# Patient Record
Sex: Female | Born: 1965 | Race: White | Hispanic: No | Marital: Married | Smoking: Never smoker
Health system: Southern US, Community
[De-identification: ages and names within clinical notes are randomized; demographics above are authoritative.]

## PROBLEM LIST (undated history)

## (undated) DIAGNOSIS — D509 Iron deficiency anemia, unspecified: Principal | ICD-10-CM

## (undated) DIAGNOSIS — S52502A Unspecified fracture of the lower end of left radius, initial encounter for closed fracture: Secondary | ICD-10-CM

## (undated) DIAGNOSIS — M722 Plantar fascial fibromatosis: Secondary | ICD-10-CM

## (undated) DIAGNOSIS — G43909 Migraine, unspecified, not intractable, without status migrainosus: Secondary | ICD-10-CM

## (undated) DIAGNOSIS — K909 Intestinal malabsorption, unspecified: Secondary | ICD-10-CM

## (undated) DIAGNOSIS — Z9884 Bariatric surgery status: Secondary | ICD-10-CM

## (undated) DIAGNOSIS — J302 Other seasonal allergic rhinitis: Secondary | ICD-10-CM

## (undated) DIAGNOSIS — M6530 Trigger finger, unspecified finger: Secondary | ICD-10-CM

## (undated) HISTORY — DX: Plantar fascial fibromatosis: M72.2

## (undated) HISTORY — DX: Migraine, unspecified, not intractable, without status migrainosus: G43.909

## (undated) HISTORY — DX: Morbid (severe) obesity due to excess calories: E66.01

## (undated) HISTORY — DX: Iron deficiency anemia, unspecified: D50.9

## (undated) HISTORY — DX: Intestinal malabsorption, unspecified: K90.9

## (undated) HISTORY — DX: Bariatric surgery status: Z98.84

---

## 2006-06-17 ENCOUNTER — Other Ambulatory Visit: Admission: RE | Admit: 2006-06-17 | Discharge: 2006-06-17 | Payer: Self-pay | Admitting: Obstetrics & Gynecology

## 2006-09-08 HISTORY — PX: LATERAL COLLATERAL LIGAMENT REPAIR, ELBOW: SHX1956

## 2006-10-27 ENCOUNTER — Encounter: Admission: RE | Admit: 2006-10-27 | Discharge: 2006-11-23 | Payer: Self-pay | Admitting: Family Medicine

## 2007-08-03 ENCOUNTER — Ambulatory Visit (HOSPITAL_BASED_OUTPATIENT_CLINIC_OR_DEPARTMENT_OTHER): Admission: RE | Admit: 2007-08-03 | Discharge: 2007-08-03 | Payer: Self-pay | Admitting: Orthopaedic Surgery

## 2009-06-29 ENCOUNTER — Ambulatory Visit (HOSPITAL_COMMUNITY): Admission: RE | Admit: 2009-06-29 | Discharge: 2009-06-29 | Payer: Self-pay | Admitting: Family Medicine

## 2010-01-31 ENCOUNTER — Encounter: Admission: RE | Admit: 2010-01-31 | Discharge: 2010-01-31 | Payer: Self-pay | Admitting: Family Medicine

## 2010-07-05 ENCOUNTER — Ambulatory Visit (HOSPITAL_COMMUNITY): Admission: RE | Admit: 2010-07-05 | Discharge: 2010-07-05 | Payer: Self-pay | Admitting: Obstetrics & Gynecology

## 2011-01-21 NOTE — Op Note (Signed)
Vickie Velazquez, Vickie Velazquez             ACCOUNT NO.:  [id number]   MEDICAL RECORD NO.:  [id number]          PATIENT TYPE:  AMB   LOCATION:  DSC                          FACILITY:  [facility]   PHYSICIAN:  Lubertha Basque. Dalldorf, M.D.DATE OF BIRTH:  [birth year]   DATE OF PROCEDURE:  08/03/2007  DATE OF DISCHARGE:                               OPERATIVE REPORT   PREOPERATIVE DIAGNOSIS:  Right elbow lateral epicondylitis.   POSTOPERATIVE DIAGNOSIS:  Right elbow lateral epicondylitis.   PROCEDURE:  Right elbow lateral release.   ANESTHESIA:  General.   ATTENDING SURGEON:  Lubertha Basque. Jerl Santos, M.D.   ASSISTANT:  Lindwood Qua, P.A.   INDICATIONS FOR PROCEDURE:  The patient is a 45 year old woman with a  long history of right elbow pain.  We have followed her for years and  she has failed exercise programs and oral anti-inflammatories and  bracing and five or six injections.  She has pain which limits ability  to use her hand for meaningful activities including work.  She is  offered operative release.  Informed operative consent was obtained  after discussion of possible complications of reaction to anesthesia,  infection, neurovascular injury.  The patient also understood about the  three month recovery process required after this type of intervention.   SUMMARY OF FINDINGS AND PROCEDURE:  Under general anesthesia through  small lateral incision the extensor origin was visualized.  A  longitudinal split was performed.  We removed some devitalized tissue  from the origin of the extensors and then closed primarily.   DESCRIPTION OF PROCEDURE:  The patient was taken to the operating suite  where general anesthetic was applied without difficulty.  She was  positioned supine and prepped and draped in normal sterile fashion.  After administration of preop IV Kefzol, the right arm was elevated,  exsanguinated, and a tourniquet inflated about the upper arm.  A small  lateral incision was made with  dissection down to the lateral condyle.  A longitudinal split was made in the extensor origin.  Some devitalized  tissue was removed from both aspects of this split.  I used a rongeur to  freshen the underlying lateral epicondyle bone.  We then placed a single  stitch in this longitudinal split and this was Vicryl suture.  The  tourniquet was deflated.  A small amount bleeding was easily controlled  with Bovie cautery and pressure.  The wound was irrigated followed by  reapproximation of subcutaneous tissues with 2-0 undyed Vicryl and skin  with nylon.  Adaptic was applied followed by dry gauze and loose Ace  wrap.  Estimated blood loss and intraoperative fluids as well as  accurate tourniquet time can be obtained from anesthesia records.   DISPOSITION:  The patient was extubated in the operating room and taken  to recovery in stable addition.  She was to go home same-day follow up  in the office in less than a week.  I will contact her by phone tonight.      Lubertha Basque Jerl Santos, M.D.  Electronically Signed     PGD/MEDQ  D:  08/03/2007  T:  08/03/2007  Job:  [id number]

## 2011-06-12 ENCOUNTER — Other Ambulatory Visit: Payer: Self-pay | Admitting: Obstetrics & Gynecology

## 2011-06-12 DIAGNOSIS — Z1231 Encounter for screening mammogram for malignant neoplasm of breast: Secondary | ICD-10-CM

## 2011-06-17 LAB — POCT HEMOGLOBIN-HEMACUE
Hemoglobin: 14.8
Operator id: 116011

## 2011-07-11 ENCOUNTER — Ambulatory Visit (HOSPITAL_COMMUNITY)
Admission: RE | Admit: 2011-07-11 | Discharge: 2011-07-11 | Disposition: A | Discharge status: Home / Self Care | Payer: BC Managed Care – PPO | Source: Ambulatory Visit | Attending: Obstetrics & Gynecology | Admitting: Obstetrics & Gynecology

## 2011-07-11 DIAGNOSIS — Z1231 Encounter for screening mammogram for malignant neoplasm of breast: Secondary | ICD-10-CM | POA: Insufficient documentation

## 2011-10-03 ENCOUNTER — Other Ambulatory Visit: Payer: Self-pay | Admitting: Orthopedic Surgery

## 2011-10-10 ENCOUNTER — Encounter (HOSPITAL_BASED_OUTPATIENT_CLINIC_OR_DEPARTMENT_OTHER): Payer: Self-pay | Admitting: *Deleted

## 2011-10-10 NOTE — Progress Notes (Signed)
Snores-denies osa No labs needed

## 2011-10-15 NOTE — Progress Notes (Signed)
Reviewed chart with Dr. Sampson Goon due to BMI of 50.6.  OK for surgery per Dr. Sampson Goon.

## 2011-10-16 ENCOUNTER — Encounter (HOSPITAL_BASED_OUTPATIENT_CLINIC_OR_DEPARTMENT_OTHER)
Admission: RE | Disposition: A | Discharge status: Home / Self Care | Payer: Self-pay | Source: Ambulatory Visit | Attending: Orthopedic Surgery

## 2011-10-16 ENCOUNTER — Encounter (HOSPITAL_BASED_OUTPATIENT_CLINIC_OR_DEPARTMENT_OTHER): Payer: Self-pay | Admitting: Anesthesiology

## 2011-10-16 ENCOUNTER — Ambulatory Visit (HOSPITAL_BASED_OUTPATIENT_CLINIC_OR_DEPARTMENT_OTHER): Payer: BC Managed Care – PPO | Admitting: Anesthesiology

## 2011-10-16 ENCOUNTER — Ambulatory Visit (HOSPITAL_BASED_OUTPATIENT_CLINIC_OR_DEPARTMENT_OTHER)
Admission: RE | Admit: 2011-10-16 | Discharge: 2011-10-16 | Disposition: A | Discharge status: Home / Self Care | Payer: BC Managed Care – PPO | Source: Ambulatory Visit | Attending: Orthopedic Surgery | Admitting: Orthopedic Surgery

## 2011-10-16 ENCOUNTER — Encounter (HOSPITAL_BASED_OUTPATIENT_CLINIC_OR_DEPARTMENT_OTHER): Payer: Self-pay

## 2011-10-16 DIAGNOSIS — K219 Gastro-esophageal reflux disease without esophagitis: Secondary | ICD-10-CM | POA: Insufficient documentation

## 2011-10-16 DIAGNOSIS — M6530 Trigger finger, unspecified finger: Secondary | ICD-10-CM | POA: Insufficient documentation

## 2011-10-16 HISTORY — PX: TRIGGER FINGER RELEASE: SHX641

## 2011-10-16 HISTORY — DX: Other seasonal allergic rhinitis: J30.2

## 2011-10-16 SURGERY — RELEASE, A1 PULLEY, FOR TRIGGER FINGER
Anesthesia: Choice | Laterality: Right

## 2011-10-16 MED ORDER — BUPIVACAINE HCL (PF) 0.25 % IJ SOLN
INTRAMUSCULAR | Status: DC | PRN
  Administered 2011-10-16: 10 mL

## 2011-10-16 MED ORDER — LACTATED RINGERS IV SOLN
INTRAVENOUS | Status: DC
  Administered 2011-10-16: 08:00:00 via INTRAVENOUS

## 2011-10-16 MED ORDER — PROMETHAZINE HCL 25 MG/ML IJ SOLN: 6.2500 mg | INTRAMUSCULAR | Status: DC | PRN

## 2011-10-16 MED ORDER — FENTANYL CITRATE 0.05 MG/ML IJ SOLN
INTRAMUSCULAR | Status: DC | PRN
  Administered 2011-10-16: 100 ug via INTRAVENOUS

## 2011-10-16 MED ORDER — OXYCODONE-ACETAMINOPHEN 5-325 MG PO TABS: ORAL_TABLET | ORAL | Status: AC

## 2011-10-16 MED ORDER — CHLORHEXIDINE GLUCONATE 4 % EX LIQD: 60.0000 mL | Freq: Once | CUTANEOUS | Status: DC

## 2011-10-16 MED ORDER — PROPOFOL 10 MG/ML IV EMUL
INTRAVENOUS | Status: DC | PRN
  Administered 2011-10-16 (×2): 20 mg via INTRAVENOUS

## 2011-10-16 MED ORDER — CEFAZOLIN SODIUM 1-5 GM-% IV SOLN
INTRAVENOUS | Status: DC | PRN
  Administered 2011-10-16: 2 g via INTRAVENOUS

## 2011-10-16 MED ORDER — ONDANSETRON HCL 4 MG/2ML IJ SOLN
INTRAMUSCULAR | Status: DC | PRN
  Administered 2011-10-16: 4 mg via INTRAVENOUS

## 2011-10-16 MED ORDER — PROPOFOL 10 MG/ML IV EMUL
INTRAVENOUS | Status: DC | PRN
  Administered 2011-10-16: 75 ug/kg/min via INTRAVENOUS

## 2011-10-16 MED ORDER — MIDAZOLAM HCL 5 MG/5ML IJ SOLN
INTRAMUSCULAR | Status: DC | PRN
  Administered 2011-10-16: 2 mg via INTRAVENOUS

## 2011-10-16 MED ORDER — FENTANYL CITRATE 0.05 MG/ML IJ SOLN: 25.0000 ug | INTRAMUSCULAR | Status: DC | PRN

## 2011-10-16 SURGICAL SUPPLY — 33 items
BANDAGE COBAN STERILE 2 (GAUZE/BANDAGES/DRESSINGS) ×2 IMPLANT
BANDAGE CONFORM 2  STR LF (GAUZE/BANDAGES/DRESSINGS) ×2 IMPLANT
BLADE MINI RND TIP GREEN BEAV (BLADE) IMPLANT
BLADE SURG 15 STRL LF DISP TIS (BLADE) ×2 IMPLANT
BLADE SURG 15 STRL SS (BLADE) ×2
BNDG CMPR MD 5X2 ELC HKLP STRL (GAUZE/BANDAGES/DRESSINGS)
BNDG ELASTIC 2 VLCR STRL LF (GAUZE/BANDAGES/DRESSINGS) IMPLANT
BNDG ESMARK 4X9 LF (GAUZE/BANDAGES/DRESSINGS) ×2 IMPLANT
CHLORAPREP W/TINT 26ML (MISCELLANEOUS) ×2 IMPLANT
CLOTH BEACON ORANGE TIMEOUT ST (SAFETY) ×2 IMPLANT
CORDS BIPOLAR (ELECTRODE) ×2 IMPLANT
COVER MAYO STAND STRL (DRAPES) ×2 IMPLANT
COVER TABLE BACK 60X90 (DRAPES) ×2 IMPLANT
CUFF TOURNIQUET SINGLE 18IN (TOURNIQUET CUFF) ×2 IMPLANT
DRAPE EXTREMITY T 121X128X90 (DRAPE) ×2 IMPLANT
DRAPE SURG 17X23 STRL (DRAPES) ×2 IMPLANT
GAUZE XEROFORM 1X8 LF (GAUZE/BANDAGES/DRESSINGS) ×2 IMPLANT
GLOVE BIO SURGEON STRL SZ7.5 (GLOVE) ×2 IMPLANT
GOWN PREVENTION PLUS XLARGE (GOWN DISPOSABLE) ×2 IMPLANT
GOWN STRL REIN XL XLG (GOWN DISPOSABLE) ×2 IMPLANT
NEEDLE HYPO 25X1 1.5 SAFETY (NEEDLE) ×2 IMPLANT
NS IRRIG 1000ML POUR BTL (IV SOLUTION) ×2 IMPLANT
PACK BASIN DAY SURGERY FS (CUSTOM PROCEDURE TRAY) ×2 IMPLANT
PADDING CAST ABS 4INX4YD NS (CAST SUPPLIES) ×1
PADDING CAST ABS COTTON 4X4 ST (CAST SUPPLIES) ×1 IMPLANT
SPONGE GAUZE 4X4 12PLY (GAUZE/BANDAGES/DRESSINGS) ×2 IMPLANT
STOCKINETTE 4X48 STRL (DRAPES) ×2 IMPLANT
SUT ETHILON 4 0 PS 2 18 (SUTURE) ×2 IMPLANT
SYR BULB 3OZ (MISCELLANEOUS) ×2 IMPLANT
SYR CONTROL 10ML LL (SYRINGE) ×2 IMPLANT
TOWEL OR 17X24 6PK STRL BLUE (TOWEL DISPOSABLE) ×2 IMPLANT
UNDERPAD 30X30 INCONTINENT (UNDERPADS AND DIAPERS) ×2 IMPLANT
WATER STERILE IRR 1000ML POUR (IV SOLUTION) IMPLANT

## 2011-10-16 NOTE — Op Note (Signed)
Dictation [id number]

## 2011-10-16 NOTE — Transfer of Care (Signed)
Immediate Anesthesia Transfer of Care Note  Patient: Vickie Velazquez  Procedure(s) Performed:  RELEASE TRIGGER FINGER/A-1 PULLEY - right small finger  Patient Location: PACU  Anesthesia Type: MAC  Level of Consciousness: awake and alert   Airway & Oxygen Therapy: Patient Spontanous Breathing  Post-op Assessment: Report given to PACU RN and Post -op Vital signs reviewed and stable  Post vital signs: Reviewed and stable  Complications: No apparent anesthesia complications

## 2011-10-16 NOTE — Anesthesia Postprocedure Evaluation (Signed)
  Anesthesia Post-op Note  Patient: Vickie Velazquez  Procedure(s) Performed:  RELEASE TRIGGER FINGER/A-1 PULLEY - right small finger  Patient Location: PACU  Anesthesia Type: MAC  Level of Consciousness: awake, alert  and oriented  Airway and Oxygen Therapy: Patient Spontanous Breathing  Post-op Pain: none  Post-op Assessment: Post-op Vital signs reviewed, Patient's Cardiovascular Status Stable, Respiratory Function Stable, Patent Airway, No signs of Nausea or vomiting and Pain level controlled  Post-op Vital Signs: Reviewed and stable  Complications: No apparent anesthesia complications

## 2011-10-16 NOTE — Brief Op Note (Signed)
10/16/2011  9:38 AM  PATIENT:  Ranae Plumber  46 y.o. female  PRE-OPERATIVE DIAGNOSIS:  right small trigger finger  POST-OPERATIVE DIAGNOSIS:  right small trigger finger  PROCEDURE:  Procedure(s): RELEASE TRIGGER FINGER/A-1 PULLEY  SURGEON:  Surgeon(s): Tami Ribas, MD  PHYSICIAN ASSISTANT:   ASSISTANTS: none   ANESTHESIA:   local and MAC  EBL:  Total I/O In: 100 [I.V.:100] Out: -   BLOOD ADMINISTERED:none  DRAINS: none   LOCAL MEDICATIONS USED:  MARCAINE 10 CC  SPECIMEN:  No Specimen  DISPOSITION OF SPECIMEN:  N/A  COUNTS:  YES  TOURNIQUET:   Total Tourniquet Time Documented: Forearm (Right) - 19 minutes  DICTATION: .Other Dictation: Dictation Number [id number]  PLAN OF CARE: Discharge to home after PACU  PATIENT DISPOSITION:  PACU - hemodynamically stable.

## 2011-10-16 NOTE — H&P (Signed)
  Vickie Velazquez is an 46 y.o. female.   Chief Complaint: right trigger digit HPI: 46 yo rhd female with triggering of right small finger.  Injected x 2 with recurrence of triggering.  Would like release of A1 pulley.    Past Medical History  Diagnosis Date  . GERD (gastroesophageal reflux disease)   . Seasonal allergies     Past Surgical History  Procedure Date  . Lateral collateral ligament repair, elbow 2008    lateral release-rt elbow    Family History  Problem Relation Age of Onset  . Anesthesia problems Mother     hard to wake up   Social History:  reports that she has never smoked. She does not have any smokeless tobacco history on file. She reports that she drinks alcohol. She reports that she does not use illicit drugs.  Allergies:  Allergies  Allergen Reactions  . Sulfa Antibiotics Rash    Medications Prior to Admission  Medication Dose Route Frequency Provider Last Rate Last Dose  . chlorhexidine (HIBICLENS) 4 % liquid 4 application  60 mL Topical Once       . lactated ringers infusion   Intravenous Continuous E. Jairo Ben, MD 20 mL/hr at 10/16/11 0802     Medications Prior to Admission  Medication Sig Dispense Refill  . estradiol (ESTRACE) 0.5 MG tablet Take 0.5 mg by mouth daily.      . fluticasone (FLONASE) 50 MCG/ACT nasal spray Place 2 sprays into the nose daily.      . naproxen (NAPROSYN) 500 MG tablet Take 500 mg by mouth 2 (two) times daily with a meal.      . omeprazole (PRILOSEC) 20 MG capsule Take 20 mg by mouth daily.      . temazepam (RESTORIL) 15 MG capsule Take 15 mg by mouth at bedtime as needed.      . Vitamin D, Ergocalciferol, (DRISDOL) 50000 UNITS CAPS Take 50,000 Units by mouth.        No results found for this or any previous visit (from the past 48 hour(s)).  No results found.   A comprehensive review of systems was negative except for: Eyes: positive for contacts/glasses Hematologic/lymphatic: positive for easy  bruising Neurological: positive for headaches  Blood pressure 146/85, pulse 71, temperature 97.9 F (36.6 C), temperature source Oral, resp. rate 20, height 5\' 4"  (1.626 m), weight 133.811 kg (295 lb), SpO2 96.00%.  General appearance: alert, cooperative and appears stated age Head: Normocephalic, without obvious abnormality, atraumatic Neck: supple, symmetrical, trachea midline Resp: clear to auscultation bilaterally Cardio: regular rate and rhythm GI: soft, non-tender; bowel sounds normal; no masses,  no organomegaly Extremities: sensation and capillary refill intact all digits.  +epl/fpl/io.  tender and palpable flexor tendon nodule volar to right small finger mp joint. Pulses: 2+ and symmetric Skin: Skin color, texture, turgor normal. No rashes or lesions Neurologic: Grossly normal Incision/Wound: na  Assessment/Plan Right small finger trigger digit.  Discussed release of A1 pulley and risks, benefits, and alternatives of surgery.  Patient wishes to proceed.  Tyan Lasure R 10/16/2011, 8:53 AM

## 2011-10-16 NOTE — Anesthesia Preprocedure Evaluation (Signed)
Anesthesia Evaluation  Patient identified by MRN, date of birth, ID band Patient awake    Reviewed: Allergy & Precautions, H&P , NPO status , Patient's Chart, lab work & pertinent test results  History of Anesthesia Complications Negative for: history of anesthetic complications  Airway Mallampati: II TM Distance: >3 FB Neck ROM: Full    Dental No notable dental hx. (+) Teeth Intact and Dental Advisory Given   Pulmonary neg pulmonary ROS,  clear to auscultation  Pulmonary exam normal       Cardiovascular neg cardio ROS Regular Normal    Neuro/Psych Negative Neurological ROS  Negative Psych ROS   GI/Hepatic Neg liver ROS, GERD-  Medicated and Controlled,  Endo/Other  Morbid obesity  Renal/GU negative Renal ROS     Musculoskeletal   Abdominal (+) obese,   Peds  Hematology   Anesthesia Other Findings   Reproductive/Obstetrics                           Anesthesia Physical Anesthesia Plan  ASA: III  Anesthesia Plan: MAC   Post-op Pain Management:    Induction:   Airway Management Planned: Simple Face Mask  Additional Equipment:   Intra-op Plan:   Post-operative Plan:   Informed Consent: I have reviewed the patients History and Physical, chart, labs and discussed the procedure including the risks, benefits and alternatives for the proposed anesthesia with the patient or authorized representative who has indicated his/her understanding and acceptance.   Dental advisory given  Plan Discussed with: CRNA and Surgeon  Anesthesia Plan Comments: (Plan routine monitors, MAC)        Anesthesia Quick Evaluation

## 2011-10-17 NOTE — Op Note (Signed)
[name], [name] NO.:  [id number]  MEDICAL RECORD NO.:  [id number]  LOCATION:                                 FACILITY:  PHYSICIAN:  Betha Loa, MD        DATE OF BIRTH:  December 01, 1965  DATE OF PROCEDURE:  10/16/2011 DATE OF DISCHARGE:                              OPERATIVE REPORT   PREOPERATIVE DIAGNOSIS:  Right small finger trigger digit.  POSTOPERATIVE DIAGNOSIS:  Right small finger trigger digit.  PROCEDURE:  Right small finger A1 pulley release.  SURGEON:  Betha Loa, MD  ASSISTANT:  None.  ANESTHESIA:  General.  INTRAVENOUS FLUIDS:  Per anesthesia flow sheet.  ESTIMATED BLOOD LOSS:  Minimal.  COMPLICATIONS:  None.  SPECIMENS:  None.  TOURNIQUET TIME:  90 minutes.  DISPOSITION:  Stable to PACU.  INDICATIONS:  Vickie Velazquez is a 46 year old female who has had triggering of the right small finger for many months.  She has had injections x2.  These have helped, but the triggering always recurs. She wished to have the right small finger trigger digit release.  Risks, benefits, and alternatives of surgery were discussed including the risks of blood loss, infection, damage to nerves, vessels, tendons, ligaments, and bone, failure of surgery, need for additional surgery, complications with wound healing, continued pain, continued triggering.  She voiced understanding these risks and elected to proceed.  OPERATIVE COURSE:  After being identified preoperatively by myself, the Patient and I agreed upon the procedure and site of procedure.  The surgical site was marked.  The risks, benefits, and alternatives of surgery were reviewed and she wished to proceed.  Surgical consent had been signed.  She was given 2 g of IV Ancef as preoperative antibiotic prophylaxis.  She has transferred to the operating room and placed in the operating room table in supine position where the right upper extremity on an arm board.  MAC anesthesia was induced by  the anesthesiologist.  10 mL of 0.25% plain Marcaine were injected in the surgical area, volar to the MP joint of the small finger.  The right upper extremity was then prepped and draped in a normal sterile orthopedic fashion.  A surgical pause was performed between surgeons, anesthesia, and operating staff and all were in agreement as to the patient, procedure, and site of procedure.  Tourniquet on the proximal aspect of the forearm was inflated to 250 mmHg after exsanguination of the limb with an Esmarch bandage.  An oblique incision was made volar to the small finger MP joint.  This was carried through the subcutaneous tissues by spreading technique.  The ulnar neurovascular bundle was identified and protected throughout the case.  Radial neurovascular bundles were protected throughout the case.  The flexor tendon was identified.  A1 pulley was identified.  It was sharply incised.  Care was taken to ensure complete release of the A1 pulley.  The first 1-2 mm of the A2 pulley was incised to allow at [facility] for the tendons to slide through.  The tendons were pulled out through the wound.  They were stuck together with some thick synovium.  This was released.  The wound was copiously irrigated with sterile  saline.  The wound was closed with 4-0 nylon in a horizontal mattress fashion.  The wound was dressed with sterile Xeroform, 4 x 4's, and wrapped with a Kling and Coban dressing lightly.  Tourniquet was deflated at 19 minutes.  The fingertips were pink with brisk capillary refill after deflation of tourniquet. Operative were broken down and the patient was awoken from anesthesia safely.  She has transferred back to stretcher and taken to PACU in stable condition.  I will see her back in the office in 1 week for postoperative followup.  I will give her Percocet 5/325 one to two p.o. q.6 hours p.r.n. pain, dispensed #30.     Betha Loa, MD     KK/MEDQ  D:  10/16/2011  T:   10/17/2011  Job:  [id number]

## 2011-10-20 ENCOUNTER — Encounter (HOSPITAL_BASED_OUTPATIENT_CLINIC_OR_DEPARTMENT_OTHER): Payer: Self-pay | Admitting: Orthopedic Surgery

## 2012-04-09 ENCOUNTER — Ambulatory Visit (INDEPENDENT_AMBULATORY_CARE_PROVIDER_SITE_OTHER): Payer: BC Managed Care – PPO | Admitting: General Surgery

## 2012-04-09 ENCOUNTER — Encounter (INDEPENDENT_AMBULATORY_CARE_PROVIDER_SITE_OTHER): Payer: Self-pay | Admitting: General Surgery

## 2012-04-09 VITALS — BP 130/78 | HR 83 | Temp 98.0°F | Resp 16 | Ht 64.0 in | Wt 277.4 lb

## 2012-04-09 DIAGNOSIS — Z6841 Body Mass Index (BMI) 40.0 and over, adult: Secondary | ICD-10-CM

## 2012-04-09 DIAGNOSIS — K219 Gastro-esophageal reflux disease without esophagitis: Secondary | ICD-10-CM

## 2012-04-09 NOTE — Progress Notes (Signed)
Patient ID: Vickie Velazquez, female   DOB: Nov 08, 1965, 46 y.o.   MRN: [id number]  Chief Complaint  Patient presents with  . Bariatric Pre-op    HPI Vickie Velazquez is a 46 y.o. female. This patient presents for her initial weight loss surgery consultation. She has attended our information session and is interested in the gastric bypass. She has trouble with her weight for about 20 years since the birth of her child and has had experience with several diets. She has tried a [employer],  hydroxycut and several other home diets.  She actually lost 70 pounds with Nutrisystem but gained a lot of back. She likes to walk but she can only walk about 1 mile after having pain in her foot from plantar fasciitis. She does have a history of reflux which he says his pre-severe at night if she does not take her medication HPI  Past Medical History  Diagnosis Date  . GERD (gastroesophageal reflux disease)   . Seasonal allergies     Past Surgical History  Procedure Date  . Lateral collateral ligament repair, elbow 2008    lateral release-rt elbow  . Trigger finger release 10/16/2011    Procedure: RELEASE TRIGGER FINGER/A-1 PULLEY;  Surgeon: Tami Ribas, MD;  Location: [facility];  Service: Orthopedics;  Laterality: Right;  right small finger    Family History  Problem Relation Age of Onset  . Anesthesia problems Mother     hard to wake up  . Cancer Mother     breast/thyroid  . Cancer Father     melanoma  . Cancer Brother     colon  . Cancer Maternal Aunt     breast  . Cancer Maternal Grandmother     breast    Social History History  Substance Use Topics  . Smoking status: Never Smoker   . Smokeless tobacco: Not on file  . Alcohol Use: Yes     occ    Allergies  Allergen Reactions  . Sulfa Antibiotics Rash    Current Outpatient Prescriptions  Medication Sig Dispense Refill  . estradiol-norethindrone (ACTIVELLA) 1-0.5 MG per tablet Take 1 tablet by mouth  daily.      . fluticasone (FLONASE) 50 MCG/ACT nasal spray Place 2 sprays into the nose daily.      . naproxen (NAPROSYN) 500 MG tablet Take 500 mg by mouth 2 (two) times daily with a meal.      . omeprazole (PRILOSEC) 20 MG capsule Take 20 mg by mouth daily.      . temazepam (RESTORIL) 15 MG capsule Take 15 mg by mouth at bedtime as needed.      . Vitamin D, Ergocalciferol, (DRISDOL) 50000 UNITS CAPS Take 50,000 Units by mouth.        Review of Systems Review of Systems All other review of systems negative or noncontributory except as stated in the HPI  Blood pressure 130/78, pulse 83, temperature 98 F (36.7 C), temperature source Temporal, resp. rate 16, height 5\' 4"  (1.626 m), weight 277 lb 6.4 oz (125.828 kg), SpO2 98.00%.  Physical Exam Physical Exam Physical Exam  Nursing note and vitals reviewed. Constitutional: She is oriented to person, place, and time. She appears well-developed and well-nourished. No distress.  HENT:  Head: Normocephalic and atraumatic.  Mouth/Throat: No oropharyngeal exudate.  Eyes: Conjunctivae and EOM are normal. Pupils are equal, round, and reactive to light. Right eye exhibits no discharge. Left eye exhibits no discharge. No scleral icterus.  Neck: Normal range of motion. Neck supple. No tracheal deviation present.  Cardiovascular: Normal rate, regular rhythm, normal heart sounds and intact distal pulses.   Pulmonary/Chest: Effort normal and breath sounds normal. No stridor. No respiratory distress. She has no wheezes.  Abdominal: Soft. Bowel sounds are normal. She exhibits no distension and no mass. There is no tenderness. There is no rebound and no guarding.  Musculoskeletal: Normal range of motion. She exhibits no edema and no tenderness.  Neurological: She is alert and oriented to person, place, and time.  Skin: Skin is warm and dry. No rash noted. She is not diaphoretic. No erythema. No pallor.  Psychiatric: She has a normal mood and affect. Her  behavior is normal. Judgment and thought content normal.    Data Reviewed   Assessment    Morbid obesity with comorbidities ofgastroesophageal reflux disease arthritis and a BMI of 47.6 Long discussion regarding the nonsurgical and surgical options. She is most interested in the Roux-en-Y gastric bypass. We did review the lap band, sleeve gastrectomy, and Roux-en-Y gastric bypass with the pros and cons of each. Again, she is most interested in the Roux-en-Y gastric bypass. I think that she be a fine candidate for this especially given her reflux symptoms. The risks of infection, bleeding, pain, scarring, weight regain, too little or too much weight loss, vitamin deficiencies and need for lifelong vitamin supplementation, hair loss, need for protein supplementation, leaks, stricture, reflux, food intolerance, need for reoperation , need for open surgery, injury to spleen or surrounding structures, DVT's, PE, and death again discussed with the patient and the patient expressed understanding. We will go ahead and start her on workup for weight loss surgery including laboratory studies, nutrition and psychology evaluation, an upper GI.    Plan    She will followup after her laboratory studies and nutrition and psychology evaluations       [name] DAVID 04/09/2012, 11:19 AM

## 2012-04-16 ENCOUNTER — Ambulatory Visit (HOSPITAL_COMMUNITY)
Admission: RE | Admit: 2012-04-16 | Discharge: 2012-04-16 | Disposition: A | Discharge status: Home / Self Care | Payer: BC Managed Care – PPO | Source: Ambulatory Visit | Attending: General Surgery | Admitting: General Surgery

## 2012-04-16 DIAGNOSIS — Z6841 Body Mass Index (BMI) 40.0 and over, adult: Secondary | ICD-10-CM | POA: Insufficient documentation

## 2012-04-16 DIAGNOSIS — K219 Gastro-esophageal reflux disease without esophagitis: Secondary | ICD-10-CM

## 2012-04-16 DIAGNOSIS — K449 Diaphragmatic hernia without obstruction or gangrene: Secondary | ICD-10-CM | POA: Insufficient documentation

## 2012-04-24 ENCOUNTER — Encounter: Payer: Self-pay | Admitting: *Deleted

## 2012-04-24 ENCOUNTER — Encounter: Payer: BC Managed Care – PPO | Attending: General Surgery | Admitting: *Deleted

## 2012-04-24 DIAGNOSIS — Z713 Dietary counseling and surveillance: Secondary | ICD-10-CM | POA: Insufficient documentation

## 2012-04-24 DIAGNOSIS — Z01818 Encounter for other preprocedural examination: Secondary | ICD-10-CM | POA: Insufficient documentation

## 2012-04-24 NOTE — Progress Notes (Signed)
  Pre-Op Assessment Visit:  Pre-Operative RYGB Surgery  Medical Nutrition Therapy:  Appt start time: 0900   End time:  1000.  Patient was seen on 04/24/2012 for Pre-Operative RYGB Nutrition Assessment. Assessment and letter of approval faxed to [facility] Bariatric Surgery Program coordinator on 04/24/2012.  Approval letter sent to [facility] center and will be available in the chart under the media tab.  Handouts given during visit include:  Pre-Op Goals   Bariatric Surgery Protein Shakes handout  Patient to call for Pre-Op and Post-Op Nutrition Education at [facility] when surgery is scheduled.

## 2012-04-24 NOTE — Patient Instructions (Signed)
   Follow Pre-Op Nutrition Goals to prepare for Gastric Bypass Surgery.   Call the [facility] at [phone] once you have been given your surgery date to enrolled in the Pre-Op Nutrition Class. You will need to attend this nutrition class 3-4 weeks prior to your surgery. 

## 2012-04-28 LAB — CBC WITH DIFFERENTIAL/PLATELET
Basophils Absolute: 0 10*3/uL (ref 0.0–0.1)
Basophils Relative: 0 % (ref 0–1)
Eosinophils Absolute: 0.2 10*3/uL (ref 0.0–0.7)
Eosinophils Relative: 3 % (ref 0–5)
HCT: 42.5 % (ref 36.0–46.0)
Hemoglobin: 14.2 g/dL (ref 12.0–15.0)
Lymphocytes Relative: 34 % (ref 12–46)
Lymphs Abs: 2.5 10*3/uL (ref 0.7–4.0)
MCH: 28.7 pg (ref 26.0–34.0)
MCHC: 33.4 g/dL (ref 30.0–36.0)
MCV: 85.9 fL (ref 78.0–100.0)
Monocytes Absolute: 0.7 10*3/uL (ref 0.1–1.0)
Monocytes Relative: 9 % (ref 3–12)
Neutro Abs: 4 10*3/uL (ref 1.7–7.7)
Neutrophils Relative %: 54 % (ref 43–77)
Platelets: 308 10*3/uL (ref 150–400)
RBC: 4.95 MIL/uL (ref 3.87–5.11)
RDW: 13.5 % (ref 11.5–15.5)
WBC: 7.4 10*3/uL (ref 4.0–10.5)

## 2012-04-28 LAB — TSH: TSH: 4.555 u[IU]/mL — ABNORMAL HIGH (ref 0.350–4.500)

## 2012-04-28 LAB — LIPID PANEL
Cholesterol: 189 mg/dL (ref 0–200)
HDL: 33 mg/dL — ABNORMAL LOW (ref >39)
LDL Cholesterol: 135 mg/dL — ABNORMAL HIGH (ref 0–99)
Total CHOL/HDL Ratio: 5.7 Ratio
Triglycerides: 106 mg/dL (ref <150)
VLDL: 21 mg/dL (ref 0–40)

## 2012-04-28 LAB — COMPREHENSIVE METABOLIC PANEL
ALT: 16 U/L (ref 0–35)
AST: 16 U/L (ref 0–37)
Albumin: 4 g/dL (ref 3.5–5.2)
Alkaline Phosphatase: 73 U/L (ref 39–117)
BUN: 16 mg/dL (ref 6–23)
CO2: 26 mEq/L (ref 19–32)
Calcium: 9.3 mg/dL (ref 8.4–10.5)
Chloride: 106 mEq/L (ref 96–112)
Creat: 0.8 mg/dL (ref 0.50–1.10)
Glucose, Bld: 89 mg/dL (ref 70–99)
Potassium: 4.4 mEq/L (ref 3.5–5.3)
Sodium: 140 mEq/L (ref 135–145)
Total Bilirubin: 0.3 mg/dL (ref 0.3–1.2)
Total Protein: 7 g/dL (ref 6.0–8.3)

## 2012-04-28 LAB — IRON AND TIBC
%SAT: 22 % (ref 20–55)
Iron: 76 ug/dL (ref 42–145)
TIBC: 352 ug/dL (ref 250–470)
UIBC: 276 ug/dL (ref 125–400)

## 2012-04-28 LAB — T4: T4, Total: 9.4 ug/dL (ref 5.0–12.5)

## 2012-04-29 LAB — H. PYLORI ANTIBODY, IGG: H Pylori IgG: <0.4 {ISR}

## 2012-05-07 ENCOUNTER — Telehealth (INDEPENDENT_AMBULATORY_CARE_PROVIDER_SITE_OTHER): Payer: Self-pay

## 2012-05-07 NOTE — Telephone Encounter (Signed)
Spoke to patient re: TSH results, patient reports she recently was seen by her PCP for a routine physical and lab's were drawn at that time (05/05/12).  Patient will have a copy of her recent lab results forwarded to our office for Dr. Biagio Quint to review.

## 2012-06-23 ENCOUNTER — Other Ambulatory Visit (INDEPENDENT_AMBULATORY_CARE_PROVIDER_SITE_OTHER): Payer: Self-pay | Admitting: General Surgery

## 2012-06-23 DIAGNOSIS — E669 Obesity, unspecified: Secondary | ICD-10-CM

## 2012-07-15 ENCOUNTER — Encounter: Payer: BC Managed Care – PPO | Attending: General Surgery | Admitting: *Deleted

## 2012-07-15 DIAGNOSIS — Z01818 Encounter for other preprocedural examination: Secondary | ICD-10-CM | POA: Insufficient documentation

## 2012-07-15 DIAGNOSIS — Z713 Dietary counseling and surveillance: Secondary | ICD-10-CM | POA: Insufficient documentation

## 2012-07-17 ENCOUNTER — Encounter: Payer: Self-pay | Admitting: *Deleted

## 2012-07-17 NOTE — Patient Instructions (Signed)
Follow:   Pre-Op Diet per MD 2 weeks prior to surgery  Phase 2- Liquids (clear/full) 2 weeks after surgery  Vitamin/Mineral/Calcium guidelines for purchasing bariatric supplements  Exercise guidelines pre and post-op per MD  Follow-up at [facility] in 2 weeks post-op for diet advancement. Contact Lara Palinkas as needed with questions/concerns. 

## 2012-07-17 NOTE — Progress Notes (Signed)
Bariatric Class:  Appt start time: 1730 end time:  1830.  Pre-Operative Nutrition Class  Patient was seen on 07/15/12 for Pre-Operative Bariatric Surgery Education at [facility].   Surgery date: 08/02/12 Surgery type: RYGB Start weight at [facility]: 276.3 lbs (04/24/12) Weight today: 276.1 lbs   Samples given per MNT protocol: Bariatric Advantage Complete Multivitamin Lot # [id number]; [id number] Exp: 12/13; 06/15  Bariatric Advantage Calcium Citrate Lot # [id number] Exp:12/13  Celebrate Vitamins Multivitamin Lot # [id number] Exp: 09/14  Celebrate Vitamins Multivitamin Complete Lot # [id number] Exp: 11/14  Celebrate Vitamins Calcium Citrate Lot # [id number] Exp:09/15  Celebrate Vitamins Sublingual B12 Lot # [id number] Exp: 05/15  Corliss Marcus Protein Powder Lot # [id number] Exp: 03/15  The following the learning objective met by the patient during this course:  Identifies Pre-Op Dietary Goals and will begin 2 weeks pre-operatively  Identifies appropriate sources of fluids and proteins   States protein recommendations and appropriate sources pre and post-operatively  Identifies Post-Operative Dietary Goals and will follow for 2 weeks post-operatively  Identifies appropriate multivitamin and calcium sources  Describes the need for physical activity post-operatively and will follow MD recommendations  States when to call healthcare provider regarding medication questions or post-operative complications  Handouts given during class include:  Pre-Op Bariatric Surgery Diet Handout  Protein Shake Handout  Post-Op Bariatric Surgery Nutrition Handout  [facility] Program Information Flyer  Support Group Information Flyer  [facility] Bariatric Supplements Price List  Follow-Up Plan: Patient will follow-up at [facility] 2 weeks post operatively for diet advancement per MD.

## 2012-07-19 ENCOUNTER — Telehealth (INDEPENDENT_AMBULATORY_CARE_PROVIDER_SITE_OTHER): Payer: Self-pay

## 2012-07-19 NOTE — Telephone Encounter (Signed)
Call to patient- not available, left a message to call our office re: patient would like to know if she can have her hiatal hernia repair at the time of her Gastric By-Pass/RNY on 08/02/12.  ** per Dr. Biagio Quint - Yes, Patient has larger than usual hiatal hernia and is necessary to fix this to do her surgery **

## 2012-07-27 ENCOUNTER — Encounter (HOSPITAL_COMMUNITY): Payer: Self-pay | Admitting: Pharmacy Technician

## 2012-07-27 ENCOUNTER — Encounter (INDEPENDENT_AMBULATORY_CARE_PROVIDER_SITE_OTHER): Payer: Self-pay | Admitting: General Surgery

## 2012-07-27 ENCOUNTER — Ambulatory Visit (INDEPENDENT_AMBULATORY_CARE_PROVIDER_SITE_OTHER): Payer: BC Managed Care – PPO | Admitting: General Surgery

## 2012-07-27 DIAGNOSIS — K2100 Gastro-esophageal reflux disease with esophagitis, without bleeding: Secondary | ICD-10-CM

## 2012-07-27 DIAGNOSIS — Z6841 Body Mass Index (BMI) 40.0 and over, adult: Secondary | ICD-10-CM

## 2012-07-27 NOTE — Progress Notes (Signed)
Patient ID: Vickie Velazquez, female   DOB: 11/07/1965, 46 y.o.   MRN: [id number]  Chief Complaint  Patient presents with  . Bariatric Pre-op    HPI Vickie Velazquez is a 46 y.o. female. This patient seen in preoperative consultation for laparoscopic Roux-en-Y gastric bypass. She has a BMI of 47 with reflux and arthritis. She has failed medical weight loss attempts. She has already been cleared by the nutritionist and the psychologist. Her original TSH was elevated but she tells me that she had this repeated and this was normal. HPI  Past Medical History  Diagnosis Date  . GERD (gastroesophageal reflux disease)   . Seasonal allergies   . Hiatal hernia     Patient reported on 04/24/12  . Morbid obesity   . Plantar fasciitis     Patient-reported on 04/24/12    Past Surgical History  Procedure Date  . Lateral collateral ligament repair, elbow 2008    lateral release-rt elbow  . Trigger finger release 10/16/2011    Procedure: RELEASE TRIGGER FINGER/A-1 PULLEY;  Surgeon: Kevin R Kuzma, MD;  Location: [facility];  Service: Orthopedics;  Laterality: Right;  right small finger    Family History  Problem Relation Age of Onset  . Anesthesia problems Mother     hard to wake up  . Cancer Mother     breast/thyroid  . Cancer Father     melanoma  . Cancer Brother     colon  . Cancer Maternal Aunt     breast  . Cancer Maternal Grandmother     breast    Social History History  Substance Use Topics  . Smoking status: Never Smoker   . Smokeless tobacco: Not on file  . Alcohol Use: Yes     Comment: occ    Allergies  Allergen Reactions  . Sulfa Antibiotics Rash    Current Outpatient Prescriptions  Medication Sig Dispense Refill  . calcium-vitamin D (OSCAL WITH D) 500-200 MG-UNIT per tablet Take 1 tablet by mouth daily.      . Cyanocobalamin (VITAMIN B-12) 1000 MCG/15ML LIQD Take 1 mL by mouth daily.      . estradiol-norethindrone (ACTIVELLA) 1-0.5 MG per tablet  Take 1 tablet by mouth daily.      . fluticasone (FLONASE) 50 MCG/ACT nasal spray Place 2 sprays into the nose daily.      . Multiple Vitamin (MULTIVITAMIN WITH MINERALS) TABS Take 1 tablet by mouth daily.      . omeprazole (PRILOSEC) 20 MG capsule Take 20 mg by mouth daily.      . temazepam (RESTORIL) 30 MG capsule Take 30 mg by mouth at bedtime as needed. sleep      . Vitamin D, Ergocalciferol, (DRISDOL) 50000 UNITS CAPS Take 50,000 Units by mouth every 7 (seven) days.         Review of Systems Review of Systems All other review of systems negative or noncontributory except as stated in the HPI  Blood pressure 140/80, pulse 110, temperature 98.2 F (36.8 C), temperature source Temporal, resp. rate 18, height 5' 4" (1.626 m), weight 268 lb 12.8 oz (121.927 kg).  Physical Exam Physical Exam Physical Exam  Nursing note and vitals reviewed. Constitutional: She is oriented to person, place, and time. She appears well-developed and well-nourished. No distress.  HENT:  Head: Normocephalic and atraumatic.  Mouth/Throat: No oropharyngeal exudate.  Eyes: Conjunctivae and EOM are normal. Pupils are equal, round, and reactive to light. Right eye exhibits no   discharge. Left eye exhibits no discharge. No scleral icterus.  Neck: Normal range of motion. Neck supple. No tracheal deviation present.  Cardiovascular: Normal rate, regular rhythm, normal heart sounds and intact distal pulses.   Pulmonary/Chest: Effort normal and breath sounds normal. No stridor. No respiratory distress. She has no wheezes.  Abdominal: Soft. Bowel sounds are normal. She exhibits no distension and no mass. There is no tenderness. There is no rebound and no guarding.  Musculoskeletal: Normal range of motion. She exhibits no edema and no tenderness.  Neurological: She is alert and oriented to person, place, and time.  Skin: Skin is warm and dry. No rash noted. She is not diaphoretic. No erythema. No pallor.  Psychiatric: She  has a normal mood and affect. Her behavior is normal. Judgment and thought content normal.    Data Reviewed   Assessment    Morbid obesity with comorbidities ofgastroesophageal reflux disease arthritis and a BMI of 47.6 She has already been cleared by the nutritionist and the psychologist and is excited about her upcoming surgery. She does have a had a hernia and we will need to address this at the same time. We discussed the surgery and the perioperative expectations as well as the pros and cons and risks of the procedure.The risks of infection, bleeding, pain, scarring, weight regain, too little or too much weight loss, vitamin deficiencies and need for lifelong vitamin supplementation, hair loss, need for protein supplementation, leaks, stricture, reflux, food intolerance, need for reoperation , need for open surgery, injury to spleen or surrounding structures, DVT's, PE, and death again discussed with the patient and the patient expressed understanding and desires to proceed with laparoscopic RYGB, possible open, intraoperative endoscopy.     Plan    We have her scheduled for Roux-en-Y gastric bypass next week.       Nathian Stencil DAVID 07/27/2012, 4:55 PM    

## 2012-07-29 ENCOUNTER — Encounter (HOSPITAL_COMMUNITY)
Admission: RE | Admit: 2012-07-29 | Discharge: 2012-07-29 | Disposition: A | Discharge status: Home / Self Care | Payer: BC Managed Care – PPO | Source: Ambulatory Visit | Attending: General Surgery | Admitting: General Surgery

## 2012-07-29 ENCOUNTER — Encounter (HOSPITAL_COMMUNITY): Payer: Self-pay

## 2012-07-29 VITALS — BP 129/85 | HR 85 | Temp 98.2°F | Resp 16 | Ht 64.0 in | Wt 266.0 lb

## 2012-07-29 DIAGNOSIS — E669 Obesity, unspecified: Secondary | ICD-10-CM

## 2012-07-29 LAB — CBC WITH DIFFERENTIAL/PLATELET
Basophils Absolute: 0 10*3/uL (ref 0.0–0.1)
Basophils Relative: 0 % (ref 0–1)
Eosinophils Absolute: 0.1 10*3/uL (ref 0.0–0.7)
Eosinophils Relative: 1 % (ref 0–5)
HCT: 41.8 % (ref 36.0–46.0)
Hemoglobin: 14 g/dL (ref 12.0–15.0)
Lymphocytes Relative: 30 % (ref 12–46)
Lymphs Abs: 2.7 10*3/uL (ref 0.7–4.0)
MCH: 29.7 pg (ref 26.0–34.0)
MCHC: 33.5 g/dL (ref 30.0–36.0)
MCV: 88.7 fL (ref 78.0–100.0)
Monocytes Absolute: 0.6 10*3/uL (ref 0.1–1.0)
Monocytes Relative: 7 % (ref 3–12)
Neutro Abs: 5.6 10*3/uL (ref 1.7–7.7)
Neutrophils Relative %: 62 % (ref 43–77)
Platelets: 280 10*3/uL (ref 150–400)
RBC: 4.71 MIL/uL (ref 3.87–5.11)
RDW: 13 % (ref 11.5–15.5)
WBC: 9 10*3/uL (ref 4.0–10.5)

## 2012-07-29 LAB — COMPREHENSIVE METABOLIC PANEL
ALT: 41 U/L — ABNORMAL HIGH (ref 0–35)
AST: 36 U/L (ref 0–37)
Albumin: 4.1 g/dL (ref 3.5–5.2)
Alkaline Phosphatase: 58 U/L (ref 39–117)
BUN: 17 mg/dL (ref 6–23)
CO2: 28 mEq/L (ref 19–32)
Calcium: 10.1 mg/dL (ref 8.4–10.5)
Chloride: 103 mEq/L (ref 96–112)
Creatinine, Ser: 0.75 mg/dL (ref 0.50–1.10)
GFR calc Af Amer: >90 mL/min (ref >90)
GFR calc non Af Amer: >90 mL/min (ref >90)
Glucose, Bld: 91 mg/dL (ref 70–99)
Potassium: 4.2 mEq/L (ref 3.5–5.1)
Sodium: 140 mEq/L (ref 135–145)
Total Bilirubin: 0.3 mg/dL (ref 0.3–1.2)
Total Protein: 7.5 g/dL (ref 6.0–8.3)

## 2012-07-29 LAB — SURGICAL PCR SCREEN
MRSA, PCR: NEGATIVE
Staphylococcus aureus: NEGATIVE

## 2012-07-29 NOTE — Patient Instructions (Signed)
[address] Buser Cassel  07/29/2012   Your procedure is scheduled on:  Monday 08-02-2012  Report to [facility] at  AM. [phone]  Call this number if you have problems the morning of surgery: [phone]   Remember: To completed your prep per Dr. Delice Lesch instructions   Do not drink liquids or  eat food:After Midnight. Sunday night 08-01-2012      Take these medicines the morning of surgery with A SIP OF WATER: Ompreazole  Do not wear jewelry, make-up or nail polish.  Do not wear lotions, powders, or perfumes. You may wear deodorant.  Do not shave 48 hours prior to surgery. Men may shave face and neck.  Do not bring valuables to the hospital.  Contacts, dentures or bridgework may not be worn into surgery.  Leave suitcase in the car. After surgery it may be brought to your room.  For patients admitted to the hospital, checkout time is 11:00 AM the day of discharge.   Patients discharged the day of surgery will not be allowed to drive home.  Name and phone number of your driver: spouse Nadine Counts [phone]  See [facility] Preparing for surgery sheet.   Please read over the following fact sheets that you were given: MRSA Information

## 2012-08-01 MED ORDER — ERTAPENEM SODIUM 1 G IJ SOLR
1.0000 g | INTRAMUSCULAR | Status: AC
  Administered 2012-08-02: 1 g via INTRAVENOUS
  Filled 2012-08-01: qty 1

## 2012-08-02 ENCOUNTER — Encounter (HOSPITAL_COMMUNITY)
Admission: RE | Disposition: A | Discharge status: Home / Self Care | Payer: Self-pay | Source: Ambulatory Visit | Attending: General Surgery

## 2012-08-02 ENCOUNTER — Encounter (HOSPITAL_COMMUNITY): Payer: Self-pay | Admitting: Anesthesiology

## 2012-08-02 ENCOUNTER — Ambulatory Visit (HOSPITAL_COMMUNITY): Payer: BC Managed Care – PPO | Admitting: Anesthesiology

## 2012-08-02 ENCOUNTER — Inpatient Hospital Stay (HOSPITAL_COMMUNITY)
Admission: RE | Admit: 2012-08-02 | Discharge: 2012-08-04 | DRG: 288 | Disposition: A | Discharge status: Home / Self Care | Payer: BC Managed Care – PPO | Source: Ambulatory Visit | Attending: General Surgery | Admitting: General Surgery

## 2012-08-02 ENCOUNTER — Encounter (HOSPITAL_COMMUNITY): Payer: Self-pay | Admitting: *Deleted

## 2012-08-02 DIAGNOSIS — K449 Diaphragmatic hernia without obstruction or gangrene: Secondary | ICD-10-CM | POA: Diagnosis present

## 2012-08-02 DIAGNOSIS — M129 Arthropathy, unspecified: Secondary | ICD-10-CM | POA: Diagnosis present

## 2012-08-02 DIAGNOSIS — K2100 Gastro-esophageal reflux disease with esophagitis, without bleeding: Secondary | ICD-10-CM

## 2012-08-02 DIAGNOSIS — K219 Gastro-esophageal reflux disease without esophagitis: Secondary | ICD-10-CM | POA: Diagnosis present

## 2012-08-02 DIAGNOSIS — E669 Obesity, unspecified: Secondary | ICD-10-CM

## 2012-08-02 DIAGNOSIS — Z6841 Body Mass Index (BMI) 40.0 and over, adult: Secondary | ICD-10-CM

## 2012-08-02 DIAGNOSIS — Z882 Allergy status to sulfonamides status: Secondary | ICD-10-CM

## 2012-08-02 DIAGNOSIS — J309 Allergic rhinitis, unspecified: Secondary | ICD-10-CM | POA: Diagnosis present

## 2012-08-02 HISTORY — PX: GASTRIC ROUX-EN-Y: SHX5262

## 2012-08-02 HISTORY — PX: HERNIA REPAIR: SHX51

## 2012-08-02 SURGERY — LAPAROSCOPIC ROUX-EN-Y GASTRIC BYPASS WITH UPPER ENDOSCOPY
Anesthesia: General | Site: Abdomen | Wound class: Clean

## 2012-08-02 MED ORDER — ROCURONIUM BROMIDE 100 MG/10ML IV SOLN
INTRAVENOUS | Status: DC | PRN
  Administered 2012-08-02: 10 mg via INTRAVENOUS
  Administered 2012-08-02: 60 mg via INTRAVENOUS
  Administered 2012-08-02: 10 mg via INTRAVENOUS
  Administered 2012-08-02: 20 mg via INTRAVENOUS

## 2012-08-02 MED ORDER — NEOSTIGMINE METHYLSULFATE 1 MG/ML IJ SOLN
INTRAMUSCULAR | Status: DC | PRN
  Administered 2012-08-02: 5 mg via INTRAVENOUS

## 2012-08-02 MED ORDER — DEXAMETHASONE SODIUM PHOSPHATE 4 MG/ML IJ SOLN
INTRAMUSCULAR | Status: DC | PRN
  Administered 2012-08-02: 10 mg via INTRAVENOUS

## 2012-08-02 MED ORDER — ONDANSETRON HCL 4 MG/2ML IJ SOLN: 4.0000 mg | INTRAMUSCULAR | Status: DC | PRN

## 2012-08-02 MED ORDER — ENOXAPARIN SODIUM 40 MG/0.4ML ~~LOC~~ SOLN
40.0000 mg | SUBCUTANEOUS | Status: DC
  Administered 2012-08-03 – 2012-08-04 (×2): 40 mg via SUBCUTANEOUS
  Filled 2012-08-02 (×3): qty 0.4

## 2012-08-02 MED ORDER — BUPIVACAINE HCL 0.25 % IJ SOLN
INTRAMUSCULAR | Status: DC | PRN
  Administered 2012-08-02: 25 mL

## 2012-08-02 MED ORDER — MIDAZOLAM HCL 5 MG/5ML IJ SOLN
INTRAMUSCULAR | Status: DC | PRN
  Administered 2012-08-02: 2 mg via INTRAVENOUS

## 2012-08-02 MED ORDER — LACTATED RINGERS IV SOLN
INTRAVENOUS | Status: DC | PRN
  Administered 2012-08-02 (×4): via INTRAVENOUS

## 2012-08-02 MED ORDER — ACETAMINOPHEN 160 MG/5ML PO SOLN: 650.0000 mg | ORAL | Status: DC | PRN

## 2012-08-02 MED ORDER — PROPOFOL 10 MG/ML IV BOLUS
INTRAVENOUS | Status: DC | PRN
  Administered 2012-08-02: 150 mg via INTRAVENOUS

## 2012-08-02 MED ORDER — TISSEEL VH 10 ML EX KIT
PACK | CUTANEOUS | Status: DC | PRN
  Administered 2012-08-02: 10 mL

## 2012-08-02 MED ORDER — GLYCOPYRROLATE 0.2 MG/ML IJ SOLN
INTRAMUSCULAR | Status: DC | PRN
  Administered 2012-08-02: 0.6 mg via INTRAVENOUS

## 2012-08-02 MED ORDER — LABETALOL HCL 5 MG/ML IV SOLN
INTRAVENOUS | Status: DC | PRN
  Administered 2012-08-02 (×2): 5 mg via INTRAVENOUS

## 2012-08-02 MED ORDER — FLUTICASONE PROPIONATE 50 MCG/ACT NA SUSP
2.0000 | Freq: Every day | NASAL | Status: DC
  Administered 2012-08-04: 2 via NASAL
  Filled 2012-08-02 (×3): qty 16

## 2012-08-02 MED ORDER — HYDROMORPHONE HCL PF 1 MG/ML IJ SOLN
INTRAMUSCULAR | Status: DC | PRN
  Administered 2012-08-02 (×2): 1 mg via INTRAVENOUS

## 2012-08-02 MED ORDER — TISSEEL VH 10 ML EX KIT
PACK | CUTANEOUS | Status: AC
  Filled 2012-08-02: qty 1

## 2012-08-02 MED ORDER — ACETAMINOPHEN 10 MG/ML IV SOLN
INTRAVENOUS | Status: AC
  Filled 2012-08-02: qty 100

## 2012-08-02 MED ORDER — OXYCODONE-ACETAMINOPHEN 5-325 MG/5ML PO SOLN
5.0000 mL | ORAL | Status: DC | PRN
  Administered 2012-08-03 (×2): 10 mL via ORAL
  Administered 2012-08-04: 5 mL via ORAL
  Administered 2012-08-04 (×2): 10 mL via ORAL
  Filled 2012-08-02 (×5): qty 10

## 2012-08-02 MED ORDER — KETOROLAC TROMETHAMINE 30 MG/ML IJ SOLN
30.0000 mg | Freq: Four times a day (QID) | INTRAMUSCULAR | Status: DC | PRN
  Administered 2012-08-02 – 2012-08-03 (×2): 30 mg via INTRAVENOUS
  Filled 2012-08-02 (×2): qty 1

## 2012-08-02 MED ORDER — KETAMINE HCL 10 MG/ML IJ SOLN
INTRAMUSCULAR | Status: DC | PRN
  Administered 2012-08-02 (×8): 4 mg via INTRAVENOUS
  Administered 2012-08-02: 50 mg via INTRAVENOUS
  Administered 2012-08-02 (×2): 4 mg via INTRAVENOUS

## 2012-08-02 MED ORDER — HYDROMORPHONE HCL PF 1 MG/ML IJ SOLN: 0.2500 mg | INTRAMUSCULAR | Status: DC | PRN

## 2012-08-02 MED ORDER — PROMETHAZINE HCL 25 MG/ML IJ SOLN: 6.2500 mg | INTRAMUSCULAR | Status: DC | PRN

## 2012-08-02 MED ORDER — SODIUM CHLORIDE 0.9 % IV SOLN
INTRAVENOUS | Status: AC
  Filled 2012-08-02: qty 1

## 2012-08-02 MED ORDER — HEPARIN SODIUM (PORCINE) 5000 UNIT/ML IJ SOLN
5000.0000 [IU] | Freq: Once | INTRAMUSCULAR | Status: AC
  Administered 2012-08-02: 5000 [IU] via SUBCUTANEOUS
  Filled 2012-08-02: qty 1

## 2012-08-02 MED ORDER — METOCLOPRAMIDE HCL 5 MG/ML IJ SOLN: INTRAMUSCULAR | Status: DC | PRN

## 2012-08-02 MED ORDER — UNJURY VANILLA POWDER
2.0000 [oz_av] | Freq: Four times a day (QID) | ORAL | Status: DC
  Administered 2012-08-04: 2 [oz_av] via ORAL

## 2012-08-02 MED ORDER — LACTATED RINGERS IR SOLN
Status: DC | PRN
  Administered 2012-08-02: 3000 mL

## 2012-08-02 MED ORDER — UNJURY CHICKEN SOUP POWDER: 2.0000 [oz_av] | Freq: Four times a day (QID) | ORAL | Status: DC

## 2012-08-02 MED ORDER — FENTANYL CITRATE 0.05 MG/ML IJ SOLN
INTRAMUSCULAR | Status: DC | PRN
  Administered 2012-08-02 (×7): 50 ug via INTRAVENOUS

## 2012-08-02 MED ORDER — KCL IN DEXTROSE-NACL 20-5-0.45 MEQ/L-%-% IV SOLN
INTRAVENOUS | Status: DC
  Administered 2012-08-02 – 2012-08-04 (×3): via INTRAVENOUS
  Filled 2012-08-02 (×8): qty 1000

## 2012-08-02 MED ORDER — MORPHINE SULFATE 2 MG/ML IJ SOLN
2.0000 mg | INTRAMUSCULAR | Status: DC | PRN
  Administered 2012-08-02 – 2012-08-03 (×4): 2 mg via INTRAVENOUS
  Filled 2012-08-02 (×4): qty 1

## 2012-08-02 MED ORDER — ONDANSETRON HCL 4 MG/2ML IJ SOLN
INTRAMUSCULAR | Status: DC | PRN
  Administered 2012-08-02: 4 mg via INTRAVENOUS

## 2012-08-02 MED ORDER — BUPIVACAINE HCL (PF) 0.25 % IJ SOLN
INTRAMUSCULAR | Status: AC
  Filled 2012-08-02: qty 60

## 2012-08-02 MED ORDER — LIDOCAINE-EPINEPHRINE 1 %-1:100000 IJ SOLN
INTRAMUSCULAR | Status: AC
  Filled 2012-08-02: qty 1

## 2012-08-02 MED ORDER — ACETAMINOPHEN 10 MG/ML IV SOLN
INTRAVENOUS | Status: DC | PRN
  Administered 2012-08-02: 1000 mg via INTRAVENOUS

## 2012-08-02 MED ORDER — UNJURY CHOCOLATE CLASSIC POWDER: 2.0000 [oz_av] | Freq: Four times a day (QID) | ORAL | Status: DC

## 2012-08-02 MED ORDER — LIDOCAINE-EPINEPHRINE 1 %-1:100000 IJ SOLN
INTRAMUSCULAR | Status: DC | PRN
  Administered 2012-08-02: 25 mL

## 2012-08-02 SURGICAL SUPPLY — 73 items
ADH SKN CLS APL DERMABOND .7 (GAUZE/BANDAGES/DRESSINGS) ×2
APL SRG 32X5 SNPLK LF DISP (MISCELLANEOUS)
APPLIER CLIP ROT 13.4 12 LRG (CLIP)
APPLIER CLIP UNV 5X34 EPIX (ENDOMECHANICALS) IMPLANT
APR CLP LRG 13.4X12 ROT 20 MLT (CLIP)
BAG SPEC RTRVL LRG 6X4 10 (ENDOMECHANICALS) ×2
BLADE SURG 15 STRL LF DISP TIS (BLADE) ×1 IMPLANT
BLADE SURG 15 STRL SS (BLADE) ×2
CABLE HIGH FREQUENCY MONO STRZ (ELECTRODE) ×2 IMPLANT
CANISTER SUCTION 2500CC (MISCELLANEOUS) ×2 IMPLANT
CHLORAPREP W/TINT 26ML (MISCELLANEOUS) ×4 IMPLANT
CLIP APPLIE ROT 13.4 12 LRG (CLIP) IMPLANT
CLOTH BEACON ORANGE TIMEOUT ST (SAFETY) ×2 IMPLANT
COVER PROBE U/S 5X48 (MISCELLANEOUS) ×2 IMPLANT
COVER SURGICAL LIGHT HANDLE (MISCELLANEOUS) ×2 IMPLANT
DERMABOND ADVANCED (GAUZE/BANDAGES/DRESSINGS) ×2
DERMABOND ADVANCED .7 DNX12 (GAUZE/BANDAGES/DRESSINGS) ×2 IMPLANT
DEVICE SUTURE ENDOST 10MM (ENDOMECHANICALS) ×2 IMPLANT
DISSECTOR BLUNT TIP ENDO 5MM (MISCELLANEOUS) IMPLANT
DRAIN CHANNEL 19F RND (DRAIN) ×2 IMPLANT
DRAPE CAMERA CLOSED 9X96 (DRAPES) ×2 IMPLANT
EVACUATOR DRAINAGE 10X20 100CC (DRAIN) ×1 IMPLANT
EVACUATOR SILICONE 100CC (DRAIN) ×1
GAUZE SPONGE 4X4 16PLY XRAY LF (GAUZE/BANDAGES/DRESSINGS) ×2 IMPLANT
GLOVE SURG SS PI 7.5 STRL IVOR (GLOVE) ×4 IMPLANT
GOWN STRL NON-REIN LRG LVL3 (GOWN DISPOSABLE) ×2 IMPLANT
GOWN STRL REIN XL XLG (GOWN DISPOSABLE) ×6 IMPLANT
HOVERMATT SINGLE USE (MISCELLANEOUS) ×2 IMPLANT
KIT BASIN OR (CUSTOM PROCEDURE TRAY) ×2 IMPLANT
KIT GASTRIC LAVAGE 34FR ADT (SET/KITS/TRAYS/PACK) IMPLANT
MARKER SKIN DUAL TIP RULER LAB (MISCELLANEOUS) ×2 IMPLANT
NEEDLE SPNL 22GX3.5 QUINCKE BK (NEEDLE) ×2 IMPLANT
NS IRRIG 1000ML POUR BTL (IV SOLUTION) ×2 IMPLANT
PACK CARDIOVASCULAR III (CUSTOM PROCEDURE TRAY) ×2 IMPLANT
PENCIL BUTTON HOLSTER BLD 10FT (ELECTRODE) ×2 IMPLANT
POUCH SPECIMEN RETRIEVAL 10MM (ENDOMECHANICALS) ×4 IMPLANT
RELOAD BLUE (STAPLE) ×4 IMPLANT
RELOAD ENDO STITCH (ENDOMECHANICALS) ×14 IMPLANT
RELOAD ENDO STITCH 2.0 (ENDOMECHANICALS)
RELOAD GOLD (STAPLE) ×8 IMPLANT
RELOAD WHITE ECR60W (STAPLE) ×6 IMPLANT
SCISSORS LAP 5X45 EPIX DISP (ENDOMECHANICALS) ×2 IMPLANT
SEALANT SURGICAL APPL DUAL CAN (MISCELLANEOUS) IMPLANT
SET IRRIG TUBING LAPAROSCOPIC (IRRIGATION / IRRIGATOR) ×2 IMPLANT
SHEARS CURVED HARMONIC AC 45CM (MISCELLANEOUS) ×2 IMPLANT
SOLUTION ANTI FOG 6CC (MISCELLANEOUS) ×2 IMPLANT
SPONGE DRAIN TRACH 4X4 STRL 2S (GAUZE/BANDAGES/DRESSINGS) ×2 IMPLANT
SPONGE GAUZE 4X4 12PLY (GAUZE/BANDAGES/DRESSINGS) IMPLANT
STAPLE ECHEON FLEX 60 POW ENDO (STAPLE) ×2 IMPLANT
STAPLER CIRC 21 3.5 SULU (STAPLE) ×2 IMPLANT
STAPLER TRANS-ORAL 21MM DST (STAPLE) ×2 IMPLANT
STAPLER VISISTAT 35W (STAPLE) ×2 IMPLANT
STRIP CLOSURE SKIN 1/2X4 (GAUZE/BANDAGES/DRESSINGS) IMPLANT
STRIP PERI DRY VERITAS 60 (STAPLE) ×12 IMPLANT
SUT ETHILON 3 0 PS 1 (SUTURE) ×2 IMPLANT
SUT MNCRL AB 4-0 PS2 18 (SUTURE) ×2 IMPLANT
SUT RELOAD ENDO STITCH 2 48X1 (ENDOMECHANICALS)
SUT RELOAD ENDO STITCH 2.0 (ENDOMECHANICALS)
SUT VICRYL 0 UR6 27IN ABS (SUTURE) ×4 IMPLANT
SUTURE RELOAD END STTCH 2 48X1 (ENDOMECHANICALS) IMPLANT
SUTURE RELOAD ENDO STITCH 2.0 (ENDOMECHANICALS) IMPLANT
SYR 20CC LL (SYRINGE) ×2 IMPLANT
SYR 50ML LL SCALE MARK (SYRINGE) ×2 IMPLANT
SYS KII OPTICAL ACCESS 15MM (TROCAR) ×2
SYSTEM KII OPTICAL ACCESS 15MM (TROCAR) ×1 IMPLANT
TOWEL OR 17X26 10 PK STRL BLUE (TOWEL DISPOSABLE) ×4 IMPLANT
TRAY FOLEY CATH 14FRSI W/METER (CATHETERS) ×2 IMPLANT
TROCAR XCEL BLADELESS 5X75MML (TROCAR) ×2 IMPLANT
TROCAR XCEL NON-BLD 5MMX100MML (ENDOMECHANICALS) ×6 IMPLANT
TROCAR Z-THREAD BLADED 5X100MM (TROCAR) ×2 IMPLANT
TUBING CONNECTING 10 (TUBING) ×2 IMPLANT
TUBING ENDO SMARTCAP (MISCELLANEOUS) ×2 IMPLANT
TUBING FILTER THERMOFLATOR (ELECTROSURGICAL) IMPLANT

## 2012-08-02 NOTE — Interval H&P Note (Signed)
History and Physical Interval Note:  08/02/2012 7:34 AM  Vickie Velazquez  has presented today for surgery, with the diagnosis of morbid obesity   The various methods of treatment have been discussed with the patient and family. After consideration of risks, benefits and other options for treatment, the patient has consented to  Procedure(s) (LRB) with comments: LAPAROSCOPIC ROUX-EN-Y GASTRIC BYPASS WITH UPPER ENDOSCOPY (N/A) as a surgical intervention .  The patient's history has been reviewed, patient examined, no change in status, stable for surgery.  I have reviewed the patient's chart and labs.  Questions were answered to the patient's satisfaction.  Pt. Seen in preop area.  Risks of procedure again discussed in lay terms.  The risks of infection, bleeding, pain, scarring, weight regain, too little or too much weight loss, vitamin deficiencies and need for lifelong vitamin supplementation, hair loss, need for protein supplementation, leaks, stricture, reflux, food intolerance, need for reoperation , need for open surgery, injury to spleen or surrounding structures, DVT's, PE, and death again discussed with the patient and the patient expressed understanding and desires to proceed with laparoscopic RYGB, possible open, intraoperative endoscopy. Will proceed with lap RYGB    Mykal Batiz DAVID

## 2012-08-02 NOTE — Anesthesia Postprocedure Evaluation (Signed)
  Anesthesia Post-op Note  Patient: Vickie Velazquez  Procedure(s) Performed: Procedure(s) (LRB): LAPAROSCOPIC ROUX-EN-Y GASTRIC BYPASS WITH UPPER ENDOSCOPY (N/A)  Patient Location: PACU  Anesthesia Type: General  Level of Consciousness: awake and alert   Airway and Oxygen Therapy: Patient Spontanous Breathing  Post-op Pain: mild  Post-op Assessment: Post-op Vital signs reviewed, Patient's Cardiovascular Status Stable, Respiratory Function Stable, Patent Airway and No signs of Nausea or vomiting  Last Vitals:  Filed Vitals:   08/02/12 1115  BP: 146/84  Pulse: 60  Temp: 36.7 C  Resp: 10    Post-op Vital Signs: stable   Complications: No apparent anesthesia complications

## 2012-08-02 NOTE — Preoperative (Signed)
Beta Blockers   Reason not to administer Beta Blockers:Not Applicable, not on home BB 

## 2012-08-02 NOTE — H&P (View-Only) (Signed)
Patient ID: Vickie Velazquez, female   DOB: March 27, 1966, 46 y.o.   MRN: [id number]  Chief Complaint  Patient presents with  . Bariatric Pre-op    HPI Vickie Velazquez is a 46 y.o. female. This patient seen in preoperative consultation for laparoscopic Roux-en-Y gastric bypass. She has a BMI of 47 with reflux and arthritis. She has failed medical weight loss attempts. She has already been cleared by the nutritionist and the psychologist. Her original TSH was elevated but she tells me that she had this repeated and this was normal. HPI  Past Medical History  Diagnosis Date  . GERD (gastroesophageal reflux disease)   . Seasonal allergies   . Hiatal hernia     Patient reported on 04/24/12  . Morbid obesity   . Plantar fasciitis     Patient-reported on 04/24/12    Past Surgical History  Procedure Date  . Lateral collateral ligament repair, elbow 2008    lateral release-rt elbow  . Trigger finger release 10/16/2011    Procedure: RELEASE TRIGGER FINGER/A-1 PULLEY;  Surgeon: Tami Ribas, MD;  Location: [facility];  Service: Orthopedics;  Laterality: Right;  right small finger    Family History  Problem Relation Age of Onset  . Anesthesia problems Mother     hard to wake up  . Cancer Mother     breast/thyroid  . Cancer Father     melanoma  . Cancer Brother     colon  . Cancer Maternal Aunt     breast  . Cancer Maternal Grandmother     breast    Social History History  Substance Use Topics  . Smoking status: Never Smoker   . Smokeless tobacco: Not on file  . Alcohol Use: Yes     Comment: occ    Allergies  Allergen Reactions  . Sulfa Antibiotics Rash    Current Outpatient Prescriptions  Medication Sig Dispense Refill  . calcium-vitamin D (OSCAL WITH D) 500-200 MG-UNIT per tablet Take 1 tablet by mouth daily.      . Cyanocobalamin (VITAMIN B-12) 1000 MCG/15ML LIQD Take 1 mL by mouth daily.      Marland Kitchen estradiol-norethindrone (ACTIVELLA) 1-0.5 MG per tablet  Take 1 tablet by mouth daily.      . fluticasone (FLONASE) 50 MCG/ACT nasal spray Place 2 sprays into the nose daily.      . Multiple Vitamin (MULTIVITAMIN WITH MINERALS) TABS Take 1 tablet by mouth daily.      Marland Kitchen omeprazole (PRILOSEC) 20 MG capsule Take 20 mg by mouth daily.      . temazepam (RESTORIL) 30 MG capsule Take 30 mg by mouth at bedtime as needed. sleep      . Vitamin D, Ergocalciferol, (DRISDOL) 50000 UNITS CAPS Take 50,000 Units by mouth every 7 (seven) days.         Review of Systems Review of Systems All other review of systems negative or noncontributory except as stated in the HPI  Blood pressure 140/80, pulse 110, temperature 98.2 F (36.8 C), temperature source Temporal, resp. rate 18, height 5\' 4"  (1.626 m), weight 268 lb 12.8 oz (121.927 kg).  Physical Exam Physical Exam Physical Exam  Nursing note and vitals reviewed. Constitutional: She is oriented to person, place, and time. She appears well-developed and well-nourished. No distress.  HENT:  Head: Normocephalic and atraumatic.  Mouth/Throat: No oropharyngeal exudate.  Eyes: Conjunctivae and EOM are normal. Pupils are equal, round, and reactive to light. Right eye exhibits no  discharge. Left eye exhibits no discharge. No scleral icterus.  Neck: Normal range of motion. Neck supple. No tracheal deviation present.  Cardiovascular: Normal rate, regular rhythm, normal heart sounds and intact distal pulses.   Pulmonary/Chest: Effort normal and breath sounds normal. No stridor. No respiratory distress. She has no wheezes.  Abdominal: Soft. Bowel sounds are normal. She exhibits no distension and no mass. There is no tenderness. There is no rebound and no guarding.  Musculoskeletal: Normal range of motion. She exhibits no edema and no tenderness.  Neurological: She is alert and oriented to person, place, and time.  Skin: Skin is warm and dry. No rash noted. She is not diaphoretic. No erythema. No pallor.  Psychiatric: She  has a normal mood and affect. Her behavior is normal. Judgment and thought content normal.    Data Reviewed   Assessment    Morbid obesity with comorbidities ofgastroesophageal reflux disease arthritis and a BMI of 47.6 She has already been cleared by the nutritionist and the psychologist and is excited about her upcoming surgery. She does have a had a hernia and we will need to address this at the same time. We discussed the surgery and the perioperative expectations as well as the pros and cons and risks of the procedure.The risks of infection, bleeding, pain, scarring, weight regain, too little or too much weight loss, vitamin deficiencies and need for lifelong vitamin supplementation, hair loss, need for protein supplementation, leaks, stricture, reflux, food intolerance, need for reoperation , need for open surgery, injury to spleen or surrounding structures, DVT's, PE, and death again discussed with the patient and the patient expressed understanding and desires to proceed with laparoscopic RYGB, possible open, intraoperative endoscopy.     Plan    We have her scheduled for Roux-en-Y gastric bypass next week.       [name] DAVID 07/27/2012, 4:55 PM

## 2012-08-02 NOTE — Transfer of Care (Signed)
Immediate Anesthesia Transfer of Care Note  Patient: Vickie Velazquez  Procedure(s) Performed: Procedure(s) (LRB) with comments: LAPAROSCOPIC ROUX-EN-Y GASTRIC BYPASS WITH UPPER ENDOSCOPY (N/A)  Patient Location: PACU  Anesthesia Type:General  Level of Consciousness: awake, alert  and patient cooperative  Airway & Oxygen Therapy: Patient Spontanous Breathing and Patient connected to face mask oxygen  Post-op Assessment: Report given to PACU RN, Post -op Vital signs reviewed and stable and Patient moving all extremities X 4  Post vital signs: Reviewed and stable  Complications: No apparent anesthesia complications

## 2012-08-02 NOTE — Anesthesia Preprocedure Evaluation (Signed)
Anesthesia Evaluation  Patient identified by MRN, date of birth, ID band Patient awake    Reviewed: Allergy & Precautions, H&P , NPO status , Patient's Chart, lab work & pertinent test results  Airway Mallampati: II TM Distance: <3 FB Neck ROM: Full    Dental No notable dental hx.    Pulmonary neg pulmonary ROS,  breath sounds clear to auscultation  Pulmonary exam normal       Cardiovascular negative cardio ROS  Rhythm:Regular Rate:Normal     Neuro/Psych negative neurological ROS  negative psych ROS   GI/Hepatic Neg liver ROS, hiatal hernia, GERD-  Medicated,  Endo/Other  Morbid obesity  Renal/GU negative Renal ROS  negative genitourinary   Musculoskeletal negative musculoskeletal ROS (+)   Abdominal   Peds negative pediatric ROS (+)  Hematology negative hematology ROS (+)   Anesthesia Other Findings   Reproductive/Obstetrics negative OB ROS                           Anesthesia Physical Anesthesia Plan  ASA: III  Anesthesia Plan: General   Post-op Pain Management:    Induction: Intravenous  Airway Management Planned: Oral ETT  Additional Equipment:   Intra-op Plan:   Post-operative Plan: Extubation in OR  Informed Consent: I have reviewed the patients History and Physical, chart, labs and discussed the procedure including the risks, benefits and alternatives for the proposed anesthesia with the patient or authorized representative who has indicated his/her understanding and acceptance.   Dental advisory given  Plan Discussed with: CRNA and Surgeon  Anesthesia Plan Comments:         Anesthesia Quick Evaluation

## 2012-08-02 NOTE — Brief Op Note (Signed)
08/02/2012  11:08 AM  PATIENT:  Ivan Croft  46 y.o. female  PRE-OPERATIVE DIAGNOSIS:  morbid obesity   POST-OPERATIVE DIAGNOSIS:  MORBID OBESITY  PROCEDURE:  Procedure(s) (LRB) with comments: LAPAROSCOPIC ROUX-EN-Y GASTRIC BYPASS WITH UPPER ENDOSCOPY (N/A)  SURGEON:  Surgeon(s) and Role:    * [name], DO - Primary    * Valarie Merino, MD - Assisting  PHYSICIAN ASSISTANT:   ASSISTANTS: martin   ANESTHESIA:   local and general  EBL:  Total I/O In: 3000 [I.V.:3000] Out: 250 [Urine:200; Other:10; Blood:40]  BLOOD ADMINISTERED:none  DRAINS: (51F) Jackson-Pratt drain(s) with closed bulb suction in the GJ junction   LOCAL MEDICATIONS USED:  MARCAINE    and LIDOCAINE   SPECIMEN:  No Specimen  DISPOSITION OF SPECIMEN:  N/A  COUNTS:  YES  TOURNIQUET:  * No tourniquets in log *  DICTATION: .Other Dictation: Dictation Number   PLAN OF CARE: Admit to inpatient   PATIENT DISPOSITION:  PACU - hemodynamically stable.   Delay start of Pharmacological VTE agent (>24hrs) due to surgical blood loss or risk of bleeding: no

## 2012-08-03 ENCOUNTER — Encounter (HOSPITAL_COMMUNITY): Payer: Self-pay | Admitting: General Surgery

## 2012-08-03 LAB — COMPREHENSIVE METABOLIC PANEL
ALT: 35 U/L (ref 0–35)
AST: 31 U/L (ref 0–37)
Albumin: 3.3 g/dL — ABNORMAL LOW (ref 3.5–5.2)
Alkaline Phosphatase: 49 U/L (ref 39–117)
BUN: 8 mg/dL (ref 6–23)
CO2: 23 mEq/L (ref 19–32)
Calcium: 8.9 mg/dL (ref 8.4–10.5)
Chloride: 105 mEq/L (ref 96–112)
Creatinine, Ser: 0.58 mg/dL (ref 0.50–1.10)
GFR calc Af Amer: >90 mL/min (ref >90)
GFR calc non Af Amer: >90 mL/min (ref >90)
Glucose, Bld: 145 mg/dL — ABNORMAL HIGH (ref 70–99)
Potassium: 3.8 mEq/L (ref 3.5–5.1)
Sodium: 137 mEq/L (ref 135–145)
Total Bilirubin: 0.3 mg/dL (ref 0.3–1.2)
Total Protein: 6.6 g/dL (ref 6.0–8.3)

## 2012-08-03 LAB — CBC WITH DIFFERENTIAL/PLATELET
Basophils Absolute: 0 10*3/uL (ref 0.0–0.1)
Basophils Relative: 0 % (ref 0–1)
Eosinophils Absolute: 0 10*3/uL (ref 0.0–0.7)
Eosinophils Relative: 0 % (ref 0–5)
HCT: 37.9 % (ref 36.0–46.0)
Hemoglobin: 12.8 g/dL (ref 12.0–15.0)
Lymphocytes Relative: 12 % (ref 12–46)
Lymphs Abs: 1.7 10*3/uL (ref 0.7–4.0)
MCH: 29.7 pg (ref 26.0–34.0)
MCHC: 33.8 g/dL (ref 30.0–36.0)
MCV: 87.9 fL (ref 78.0–100.0)
Monocytes Absolute: 1.4 10*3/uL — ABNORMAL HIGH (ref 0.1–1.0)
Monocytes Relative: 10 % (ref 3–12)
Neutro Abs: 11 10*3/uL — ABNORMAL HIGH (ref 1.7–7.7)
Neutrophils Relative %: 78 % — ABNORMAL HIGH (ref 43–77)
Platelets: 257 10*3/uL (ref 150–400)
RBC: 4.31 MIL/uL (ref 3.87–5.11)
RDW: 13.2 % (ref 11.5–15.5)
WBC: 14.1 10*3/uL — ABNORMAL HIGH (ref 4.0–10.5)

## 2012-08-03 NOTE — Progress Notes (Signed)
Pt alert and oriented; VSS; denies any nausea or vomiting; will begin bariatric clear liquids today; denies burping or flatus; denies BM; voiding without difficulty; ambulating in hallways without difficulty; c/o some abdominal soreness with relief from prn meds; using incentive spirometer as directed; pt already has follow up appts with [facility] and [facility]; aware of BELT program and support group; discussed plan of care and pt verbalized understanding of; discharge instructions given for pt to review; questions answered. GASTRIC BYPASS/SLEEVE DISCHARGE INSTRUCTIONS  Drs. Fredrik Rigger, Hoxworth, Wilson, and Pentress Call if you have any problems.   Call [phone] and ask for the surgeon on call.    If you need immediate assistance come to the ER at [facility]. Tell the ER personnel that you are a new post-op gastric bypass patient. Signs and symptoms to report:   Severe vomiting or nausea. If you cannot tolerate clear liquids for longer than 1 day, you need to call your surgeon.    Abdominal pain which does not get better after taking your pain medication   Fever greater than 101 F degree   Difficulty breathing   Chest pain    Redness, swelling, drainage, or foul odor at incision sites    If your incisions open or pull apart   Swelling or pain in calf (lower leg)   Diarrhea, frequent watery, uncontrolled bowel movements.   Constipation, (no bowel movements for 3 days) if this occurs, Take Milk of Magnesia, 2 tablespoons by mouth, 3 times a day for 2 days if needed.  Call your doctor if constipation continues. Stop taking Milk of Magnesia once you have had a bowel movement. You may also use Miralax according to the label instructions.   Anything you consider "abnormal for you".   Normal side effects after Surgery:   Unable to sleep at night or concentrate   Irritability   Being tearful (crying) or depressed   These are common complaints, possibly related to your anesthesia, stress of surgery and  change in lifestyle, that usually go away a few weeks after surgery.  If these feelings continue, call your medical doctor.  Wound Care You may have surgical glue, steri-strips, or staples over your incisions after surgery.  Surgical glue:  Looks like a clear film over your incisions and will wear off gradually. Steri-strips: Strips of tape over your incisions. You may notice a yellowish color on the skin underneath the steri-strips. This is a substance used to make the steri-strips stick better. Do not pull the steri-strips off - let them fall off.  Staples: Cherlynn Polo may be removed before you leave the hospital. If you go home with staples, call [facility] [phone]) for an appointment with your surgeon's nurse to have staples removed in 7 - 10 days. Showering: You may shower two days after your surgery unless otherwise instructed by your surgeon. Wash gently around wounds with warm soapy water, rinse well, and gently pat dry.  If you have a drain, you may need someone to hold this while you shower. Avoid tub baths until staples are removed and incisions are healed.    Medications   Medications should be liquid or crushed if larger than the size of a dime.  Extended release pills should not be crushed.   Depending on the size and number of medications you take, you may need to stagger/change the time you take your medications so that you do not over-fill your pouch.    Make sure you follow-up with your primary  care physician to make medication adjustments needed during rapid weight loss and life-style adjustment.   If you are diabetic, follow up with the doctor that prescribes your diabetes medication(s) within one week after surgery and check your blood sugar regularly.   Do not drive while taking narcotics!   Do not take acetaminophen (Tylenol) and Roxicet or Lortab Elixir at the same time since these pain medications contain acetaminophen.  Diet at home: (First 2 Weeks) You  will see the nutritionist two weeks after your surgery. She will advance your diet if you are tolerating liquids well. Once at home, if you have severe vomiting or nausea and cannot tolerate clear liquids lasting longer than 1 day, call your surgeon.  Begin high protein shake 2 ounces every 3 hours, 5 - 6 times per day.  Gradually increase the amount you drink as tolerated.  You may find it easier to slowly sip shakes throughout the day.  It is important to get your proteins in first.   Protein Shake   Drink at least 2 ounces of shake 5-6 times per day   Each serving of protein shakes should have a minimum of 15 grams of protein and no more than 5 grams of carbohydrate    Increase the amount of protein shake you drink as tolerated   Protein powder may be added to fluids such as non-fat milk or Lactaid milk (limit to 20 grams added protein powder per serving   The initial goal is to drink at least 8 ounces of protein shake/drink per day (or as directed by the nutritionist). Some examples of protein shakes are [employer], [employer]illard[employer], EAS Edge HP, and Unjury. Hydration   Gradually increase the amount of water and other liquids as tolerated (See Acceptable Fluids)   Gradually increase the amount of protein shake as tolerated     Sip fluids slowly and throughout the day   May use Sugar substitutes, use sparingly (limit to 6 - 8 packets per day). Your fluid goal is 64 ounces of fluid daily. It may take a few weeks to build up to this.         32 oz (or more) should be clear liquids and 32 oz (or more) should be full liquids.         Liquids should not contain sugar, caffeine, or carbonation! Acceptable Fluids Clear Liquids:   Water or Sugar-free flavored water, Fruit H2O   Decaffeinated coffee or tea (sugar-free)   Crystal Lite, Wyler's Lite, Minute Maid Lite   Sugar-free Jell-O   Bouillon or broth   Sugar-free Popsicle:   *Less than 20 calories each; Limit 1 per day   Full Liquids:               Protein Shakes/Drinks + 2 choices per day of other full liquids shown below.    Other full liquids must be: No more than 12 grams of Carbs per serving,  No more than 3 grams of Fat per serving   Strained low-fat cream soup   Non-Fat milk   Fat-free Lactaid Milk   Sugar-free yogurt (Dannon Lite & Fit) Vitamins and Minerals (Start 1 day after surgery unless otherwise directed)   2 Chewable Multivitamin / Multimineral Supplement (i.e. Centrum for Adults)   Chewable Calcium Citrate with Vitamin D-3. Take 1500 mg each day.           (Example: 3 Chewable Calcium Plus 600 with Vitamin D-3 can be found at [facility])  Vitamin B-12, 350 - 500 micrograms (oral tablet) each day   Do not mix multivitamins containing iron with calcium supplements; take 2 hours   apart   Do not substitute Tums (calcium carbonate) for your calcium   Menstruating women and those at risk for anemia may need extra iron. Talk with your doctor to see if you need additional iron.    If you need extra iron:  Total daily Iron recommendations (including Vitamins) = 50 - 100 mg Iron/day Do not stop taking or change any vitamins or minerals until you talk to your nutritionist or surgeon. Your nutritionist and / or physician must approve all vitamin and mineral supplements. Exercise For maximum success, begin exercising as soon as your doctor recommends. Make sure your physician approves any physical activity.   Depending on fitness level, begin with a simple walking program   Walk 5-15 minutes each day, 7 days per week.    Slowly increase until you are walking 30-45 minutes per day   Consider joining our BELT program. [phone] or email [email or web address] Things to remember:    You may have sexual relations when you feel comfortable. It is VERY important for female patients to use a reliable birth control method. Fertility often increases after surgery. Do not get pregnant for at least 18 months.   It is very important to  keep all follow up appointments with your surgeon, nutritionist, primary care physician, and behavioral health practitioner. After the first year, please follow up with your bariatric surgeon at least once a year in order to maintain best weight loss results.  [facility]: [phone] [facility]: [phone]   Free counseling is available for you and your family through collaboration between [facility] and [place]. Please call [phone] and leave a message.    Consider purchasing a medical alert bracelet that says you had gastric bypass surgery.    The [facility] has a free Bariatric Surgery Support Group that meets monthly, the 3rd Thursday, 6 pm, Classroom #1, [place]. You may register online at [email or web address], but registration is not necessary. Select Classes and Support Groups, Bariatric Surgery, or Call [phone]      Do not return to work or drive until cleared by your surgeon   Use your CPAP when sleeping if applicable   Do not lift anything greater than ten pounds for at least two weeks  Joen Laura, RN Bariatric Nurse Coordinator

## 2012-08-03 NOTE — Op Note (Signed)
Vickie Velazquez, Vickie Velazquez NO.:  [id number]  MEDICAL RECORD NO.:  [id number]  LOCATION:  1540                         FACILITY:  [facility]  PHYSICIAN:  [name], MD       DATE OF BIRTH:  12-07-1965  DATE OF PROCEDURE:  08/02/2012 DATE OF DISCHARGE:                              OPERATIVE REPORT   PROCEDURE:  Laparoscopic Roux-en-Y gastric bypass with intraoperative endoscopy and laparoscopic hiatal hernia repair.  SURGEON:  [name], MD  ASSISTANT:  Dr. Daphine Deutscher.  ANESTHESIA:  General endotracheal anesthesia with 50 mL of 1% lidocaine with epinephrine and 0.25% Marcaine in a 50:50 mixture.  ESTIMATED BLOOD LOSS:  50 mL.  DRAINS:  A 19-French Blake drain placed in the area of the gastrojejunal anastomosis.  SPECIMENS:  None.  COMPLICATIONS:  None apparent.  FINDINGS:  Circular stapled gastroduodenal jejunal anastomosis with a 21- mm EEA stapler.  Side-to-side functional end-to-end anastomosis for the jejunojejunostomy, hand-sewn common enterotomy with closure of the mesenteric defect.  COMPLICATION:  None.  INDICATION FOR PROCEDURE:  Ms. Mohs is a 46 year old female with a BMI of 2, who had failed medical weight loss attempts.  She is requesting definitive surgical treatment.  OPERATIVE DETAILS:  Ms. Bullard was seen and evaluated in the preoperative area.  Risks and benefits of the procedure were again discussed in lay terms.  Informed consent was obtained.  She was given prophylactic antibiotics and subcutaneous heparin and taken to the operating room, placed on table in supine position.  General endotracheal tube anesthesia was obtained and Foley catheter was placed. Her abdomen was prepped and draped in standard surgical fashion. Procedure time-out was performed with the team members to confirm patient, procedure.  A 5-mm Optiview trocar was used to access the abdomen and left upper quadrant.  Pneumoperitoneum was obtained  and laparoscope was introduced and there was no evidence of bowel injury upon entry.  Then, a 5-mm trocar was placed and a 15-mm right rectus port and a 5-mm right upper quadrant port were placed under direct visualization.  We had a left lateral abdominal trocar for a second assistant working port.  The omentum was rolled over the colon and the ligament of Treitz was identified.  It measured 50 cm from the ligament of Treitz and divided the small bowel with 60 mm white load staples. The mesentery was divided with a Harmonic scalpel to the base, taking care not to undermine any of the direction.  Suture was placed on end of the Roux limb for marking and measured out 100 cm on the Roux limb and 2 enterotomies were made on the antimesenteric surface of the bowel and side-to-side stapled anastomosis was created with a 60-mm white load stapler.  The internal staple line was inspected.  There was no evidence of bleeding.  I placed 4 sutures to approximate the edge of the enterotomy with a plan to staple.  The enterotomy closed, however, it did not appear to line up very nicely, so I cut out these sutures and closed the enterotomies with 2-0 Surgidac sutures using running Lembert sutures from each end and securing this in the middle.  Some additional sutures were placed in areas that  looked like it might have some gapping of the wound.  The closure appeared adequate.  A Crotch stitch was placed as well and Tisseel and fibrin glue was placed over the staple lines and over the enterotomy and the suture was placed to align the Roux limb with the biliopancreatic limb and this was used to hold the alignments for closure of the jejunojejunostomy defect.  A 2-0 Surgidac suture was used to close the defect starting at the base of the wound surrounding this and closing the peritoneum and sutured to the previously placed alignment suture.  This appeared to close the defect adequately. The omentum was  then divided up to the colon and the Roux limb appeared to reach without tension.  Through a separate epigastric stab incision, Nathanson liver retractor was used to retract the left lobe of the liver.  Pars flaccida was entered and the retrogastric tunnel was created.  The fat along the lesser curve was divided transversely to the edge of the stomach with the white load covered with Peri-Strips.  All the tubes were removed from the mouth and a plane firing of the gold load stapler was used to take the first firing across the lesser curve of the stomach.  We measured 5 cm from the GE junction.  We dissected retrogastric up to the left crus and above the short gastric vessels. The peritoneum was taken down at the angle of His as well as another firing of the gold load covered with Peri-Strips were used to perform the first firing of the lateral wall of the gastric pouch.  A second firing was used to nearly complete the transection, although a third firing was used for just a small portion and then complete transection. We were able to see previously 2 complete divisions, but she did not really have any stomach up in the hiatal hernia.  The crus was dissected anteriorly and a figure-of-eight suture was used to approximate the right and left diaphragmatic crus anteriorly.  Another single stitch was used also to tighten this up just a little bit more.  Care was taken not to make this too tight around the esophagus.  A small gastrotomy was gastrotomy was made in the distal staple line in the uncovered portion of the staple line.  The 21-mm OrVil tubing was passed through the mouth and exited through our gastrotomy and the suture was cut and tubing was removed.  I threw it away and changed gloves and two 2-0 Lembert sutures were placed on each side of the anvil tightening the fit of the gastric pouch around the anvil.  Then Roux limb enterotomy was made after we brought this up to make sure with  the Roux limb, mesentery was not twisted.  I enlarged the 15-mm port site to accommodate passage of the stapler and a 21-mm EEA 3.5-mm stapler was passed through the abdominal wall and was placed in the Roux limb enterotomy and the spike was exited out on the antimesenteric site.  It was mated with the angled and stapled anastomosis which was created.  The Roux limb enterotomy was closed by transecting this candy cane portion of the Roux limb.  Piece of bowel was placed in an EndoCatch bag and removed from the abdomen.  It was not sent to Pathology.  Two 2-0 Surgidac sutures were placed on each side of the anastomosis to take some tension off the staple line and the bowel was clamped and endoscopy was performed by Dr. Daphine Deutscher, passing the  fiberoptic endoscope in the mouth and anastomosis submerged under water as it insufflated this with air. We had good distention of the bowel and the anastomosis.  Pouch appeared to be adequate size and there was no evidence of air bubbles.  The anastomosis was patent and easily cannulated.  We suctioned the area and removed the scope.  I suctioned remainder of the fluid, using the Tisseel fibrin glue, covered the anastomosis with the Tisseel.  I placed 19-French Harrison Mons drain just posterior and lateral to the gastrojejunal anastomosis.  This was exited through one of the left upper quadrant trocar site, sutured in place with a 2-0 nylon drain stitch.  I then approximated the fascia at the rectus 15 mm port site and opened fascia and the sutures were secured and the abdomen was re- insufflated with carbon dioxide gas and the abdominal wall closure was noted to be adequate without any evidence of bowel injury.  Final trocars were removed under direct visualization and the abdominal wall was noted be hemostatic.  The skin edges were injected with total of 50 mL of 1% lidocaine with epinephrine and 0.25% Marcaine in a 50:50 mixture and the skin edges were  approximated with 4-0 Monocryl subcuticular suture.  Skin was washed and dried.  Dermabond was applied.  All sponge, needle, and instrument counts were correct at the end of the case.  The patient tolerated the procedure well without apparent complication.          ______________________________ [name], MD     BL/MEDQ  D:  08/02/2012  T:  08/03/2012  Job:  [id number]

## 2012-08-03 NOTE — Progress Notes (Signed)
1 Day Post-Op  Subjective: Feels okay but more "sore" today.  Objective: Vital signs in last 24 hours: Temp:  [97.6 F (36.4 C)-99.7 F (37.6 C)] 99.1 F (37.3 C) (11/26 0552) Pulse Rate:  [55-89] 83  (11/26 0552) Resp:  [10-20] 20  (11/26 0552) BP: (112-159)/(67-84) 112/69 mmHg (11/26 0552) SpO2:  [91 %-100 %] 96 % (11/26 0552) Last BM Date: 08/01/12  Intake/Output from previous day: 11/25 0701 - 11/26 0700 In: 5066.7 [I.V.:5066.7] Out: 3920 [Urine:3730; Drains:140; Blood:40] Intake/Output this shift:    General appearance: alert, cooperative and no distress Resp: nonlabored Cardio: normal rate, regular GI: soft, appropriate upper abdominal tenderness, ND, no peritoneal signs, JP ss, no sign of wound infection  Lab Results:   [facility] 08/03/12 0432  WBC 14.1*  HGB 12.8  HCT 37.9  PLT 257   BMET  Basename 08/03/12 0432  NA 137  K 3.8  CL 105  CO2 23  GLUCOSE 145*  BUN 8  CREATININE 0.58  CALCIUM 8.9   PT/INR No results found for this basename: LABPROT:2,INR:2 in the last 72 hours ABG No results found for this basename: PHART:2,PCO2:2,PO2:2,HCO3:2 in the last 72 hours  Studies/Results: No results found.  Anti-infectives: Anti-infectives     Start     Dose/Rate Route Frequency Ordered Stop   08/02/12 0600   ertapenem (INVANZ) 1 g in sodium chloride 0.9 % 50 mL IVPB        1 g 100 mL/hr over 30 Minutes Intravenous On call to O.R. 08/01/12 1442 08/02/12 0749          Assessment/Plan: s/p Procedure(s) (LRB) with comments: LAPAROSCOPIC ROUX-EN-Y GASTRIC BYPASS WITH UPPER ENDOSCOPY (N/A) mobilize today, trial clears  LOS: 1 day    [name] DAVID 08/03/2012

## 2012-08-03 NOTE — Care Management Note (Signed)
    Page 1 of 1   08/03/2012     11:59:30 AM   CARE MANAGEMENT NOTE 08/03/2012  Patient:  Vickie Velazquez, Vickie Velazquez   Account Number:  [id number]  Date Initiated:  08/03/2012  Documentation initiated by:  Lorenda Ishihara  Subjective/Objective Assessment:   46 yo female admitted s/p gastric bypass. PTA lived at home with spouse.     Action/Plan:   Home when stable   Anticipated DC Date:  08/05/2012   Anticipated DC Plan:  HOME/SELF CARE      DC Planning Services  CM consult      Choice offered to / List presented to:             Status of service:  Completed, signed off Medicare Important Message given?   (If response is "NO", the following Medicare IM given date fields will be blank) Date Medicare IM given:   Date Additional Medicare IM given:    Discharge Disposition:  HOME/SELF CARE  Per UR Regulation:  Reviewed for med. necessity/level of care/duration of stay  If discussed at Long Length of Stay Meetings, dates discussed:    Comments:

## 2012-08-04 MED ORDER — ONDANSETRON 4 MG PO TBDP: 4.0000 mg | ORAL_TABLET | Freq: Three times a day (TID) | ORAL | Status: DC | PRN

## 2012-08-04 MED ORDER — URSODIOL 300 MG PO CAPS: 300.0000 mg | ORAL_CAPSULE | Freq: Two times a day (BID) | ORAL | Status: DC

## 2012-08-04 MED ORDER — OXYCODONE-ACETAMINOPHEN 5-325 MG/5ML PO SOLN: 5.0000 mL | ORAL | Status: DC | PRN

## 2012-08-04 NOTE — Discharge Summary (Signed)
Physician Discharge Summary  Patient ID: Vickie Velazquez MRN: [id number] DOB/AGE: 1966-09-02 46 y.o.  Admit date: 08/02/2012 Discharge date: 08/04/2012  Admission Diagnoses: obesity  Discharge Diagnoses: obesity Active Problems:  * No active hospital problems. *    Discharged Condition: stable  Hospital Course: to OR 08/02/12 for lap RYGB.  No apparent complications.  She has done well and pain continued to improve.  Bariatric diet started and she has done well with this.  Pain controlled and diet advanced.  She was HD stable and ready for discharge on POD 2  Consults: None  Significant Diagnostic Studies: none  Treatments: surgery: 08/02/12 lap RYGB  Disposition: 01-Home or Self Care  Discharge Orders    Future Appointments: Provider: Department: Dept Phone: Center:   08/20/2012 8:30 AM [name], DO [facility] [phone] None     Future Orders Please Complete By Expires   Increase activity slowly      Discharge instructions      Comments:   Call [phone] for follow up appointment with Biagio Quint in 3 weeks. Crush all medications or liquid meds for 4 weeks. Follow postop diet  Protein shakes as per postop diet.   Call MD for:  temperature >100.4      Call MD for:  persistant nausea and vomiting      Call MD for:  severe uncontrolled pain      Call MD for:  redness, tenderness, or signs of infection (pain, swelling, redness, odor or green/yellow discharge around incision site)      Call MD for:  difficulty breathing, headache or visual disturbances      Call MD for:  persistant dizziness or light-headedness          Medication List     As of 08/04/2012  8:11 AM    TAKE these medications         calcium-vitamin D 500-200 MG-UNIT per tablet   Commonly known as: OSCAL WITH D   Take 1 tablet by mouth daily.      estradiol-norethindrone 1-0.5 MG per tablet   Commonly known as: ACTIVELLA   Take 1 tablet by mouth daily.      fluticasone 50  MCG/ACT nasal spray   Commonly known as: FLONASE   Place 2 sprays into the nose daily.      multivitamin with minerals Tabs   Take 1 tablet by mouth daily.      omeprazole 20 MG capsule   Commonly known as: PRILOSEC   Take 20 mg by mouth daily.      ondansetron 4 MG disintegrating tablet   Commonly known as: ZOFRAN-ODT   Take 1 tablet (4 mg total) by mouth every 8 (eight) hours as needed for nausea.      oxyCODONE-acetaminophen 5-325 MG/5ML solution   Commonly known as: ROXICET   Take 5-10 mLs by mouth every 4 (four) hours as needed.      temazepam 30 MG capsule   Commonly known as: RESTORIL   Take 30 mg by mouth at bedtime as needed. sleep      ursodiol 300 MG capsule   Commonly known as: ACTIGALL   Take 1 capsule (300 mg total) by mouth 2 (two) times daily.      Vitamin B-12 1000 MCG/15ML Liqd   Take 1 mL by mouth daily.      Vitamin D (Ergocalciferol) 50000 UNITS Caps   Commonly known as: DRISDOL   Take 50,000 Units by mouth every 7 (seven)  days.         Signed: [name] DAVID 08/04/2012, 8:11 AM

## 2012-08-04 NOTE — Progress Notes (Signed)
2 Days Post-Op  Subjective: Doing well.  Feels better.  Objective: Vital signs in last 24 hours: Temp:  [98 F (36.7 C)-99.3 F (37.4 C)] 98.7 F (37.1 C) (11/27 0526) Pulse Rate:  [67-81] 77  (11/27 0526) Resp:  [16-20] 18  (11/27 0526) BP: (100-137)/(53-85) 100/53 mmHg (11/27 0526) SpO2:  [93 %-96 %] 93 % (11/27 0526) Last BM Date: 08/02/12  Intake/Output from previous day: 11/26 0701 - 11/27 0700 In: 3155 [P.O.:180; I.V.:2975] Out: 1750 [Urine:1700; Drains:50] Intake/Output this shift:    General appearance: alert, cooperative and no distress Resp: nonlabored Cardio: normal rate, regular GI: soft, minimal incisional tenderness, ND, no infection, JP ss, no peritoneal signs Extremities: scd's bilat  Lab Results:   [facility] 08/03/12 0432  WBC 14.1*  HGB 12.8  HCT 37.9  PLT 257   BMET  Basename 08/03/12 0432  NA 137  K 3.8  CL 105  CO2 23  GLUCOSE 145*  BUN 8  CREATININE 0.58  CALCIUM 8.9   PT/INR No results found for this basename: LABPROT:2,INR:2 in the last 72 hours ABG No results found for this basename: PHART:2,PCO2:2,PO2:2,HCO3:2 in the last 72 hours  Studies/Results: No results found.  Anti-infectives: Anti-infectives     Start     Dose/Rate Route Frequency Ordered Stop   08/02/12 0600   ertapenem (INVANZ) 1 g in sodium chloride 0.9 % 50 mL IVPB        1 g 100 mL/hr over 30 Minutes Intravenous On call to O.R. 08/01/12 1442 08/02/12 0749          Assessment/Plan: s/p Procedure(s) (LRB) with comments: LAPAROSCOPIC ROUX-EN-Y GASTRIC BYPASS WITH UPPER ENDOSCOPY (N/A) she looks and feels well.  should be able to discharge today  LOS: 2 days    Vickie Velazquez 08/04/2012

## 2012-08-04 NOTE — Progress Notes (Signed)
Pt alert and oriented; VSS; denies any nausea or vomiting; tolerating bariatric diet well; burping; +flatus; no BM; voiding without difficulty; ambulating in hallways without difficulty; c/o some abdominal soreness with relief from prn meds; using incentive spirometer as directed; pt already has follow up appts with [facility] and [facility]; aware of support group and BELT program; discharge instructions reviewed and pt verbalized understanding of; questions answered.  GASTRIC BYPASS/SLEEVE DISCHARGE INSTRUCTIONS  Drs. Fredrik Rigger, Hoxworth, Wilson, and [place] Call if you have any problems.   Call [phone] and ask for the surgeon on call.    If you need immediate assistance come to the ER at [facility]. Tell the ER personnel that you are a new post-op gastric bypass patient. Signs and symptoms to report:   Severe vomiting or nausea. If you cannot tolerate clear liquids for longer than 1 day, you need to call your surgeon.    Abdominal pain which does not get better after taking your pain medication   Fever greater than 101 F degree   Difficulty breathing   Chest pain    Redness, swelling, drainage, or foul odor at incision sites    If your incisions open or pull apart   Swelling or pain in calf (lower leg)   Diarrhea, frequent watery, uncontrolled bowel movements.   Constipation, (no bowel movements for 3 days) if this occurs, Take Milk of Magnesia, 2 tablespoons by mouth, 3 times a day for 2 days if needed.  Call your doctor if constipation continues. Stop taking Milk of Magnesia once you have had a bowel movement. You may also use Miralax according to the label instructions.   Anything you consider "abnormal for you".   Normal side effects after Surgery:   Unable to sleep at night or concentrate   Irritability   Being tearful (crying) or depressed   These are common complaints, possibly related to your anesthesia, stress of surgery and change in lifestyle, that usually go away a few weeks  after surgery.  If these feelings continue, call your medical doctor.  Wound Care You may have surgical glue, steri-strips, or staples over your incisions after surgery.  Surgical glue:  Looks like a clear film over your incisions and will wear off gradually. Steri-strips: Strips of tape over your incisions. You may notice a yellowish color on the skin underneath the steri-strips. This is a substance used to make the steri-strips stick better. Do not pull the steri-strips off - let them fall off.  Staples: Cherlynn Polo may be removed before you leave the hospital. If you go home with staples, call [facility] [phone]) for an appointment with your surgeon's nurse to have staples removed in 7 - 10 days. Showering: You may shower two days after your surgery unless otherwise instructed by your surgeon. Wash gently around wounds with warm soapy water, rinse well, and gently pat dry.  If you have a drain, you may need someone to hold this while you shower. Avoid tub baths until staples are removed and incisions are healed.    Medications   Medications should be liquid or crushed if larger than the size of a dime.  Extended release pills should not be crushed.   Depending on the size and number of medications you take, you may need to stagger/change the time you take your medications so that you do not over-fill your pouch.    Make sure you follow-up with your primary care physician to make medication adjustments needed during rapid weight loss  and life-style adjustment.   If you are diabetic, follow up with the doctor that prescribes your diabetes medication(s) within one week after surgery and check your blood sugar regularly.   Do not drive while taking narcotics!   Do not take acetaminophen (Tylenol) and Roxicet or Lortab Elixir at the same time since these pain medications contain acetaminophen.  Diet at home: (First 2 Weeks) You will see the nutritionist two weeks after your surgery.  She will advance your diet if you are tolerating liquids well. Once at home, if you have severe vomiting or nausea and cannot tolerate clear liquids lasting longer than 1 day, call your surgeon.  Begin high protein shake 2 ounces every 3 hours, 5 - 6 times per day.  Gradually increase the amount you drink as tolerated.  You may find it easier to slowly sip shakes throughout the day.  It is important to get your proteins in first.   Protein Shake   Drink at least 2 ounces of shake 5-6 times per day   Each serving of protein shakes should have a minimum of 15 grams of protein and no more than 5 grams of carbohydrate    Increase the amount of protein shake you drink as tolerated   Protein powder may be added to fluids such as non-fat milk or Lactaid milk (limit to 20 grams added protein powder per serving   The initial goal is to drink at least 8 ounces of protein shake/drink per day (or as directed by the nutritionist). Some examples of protein shakes are [employer], [employer]illard's, EAS Edge HP, and [place]jury. Hydration   Gradually increase the amount of water and other liquids as tolerated (See Acceptable Fluids)   Gradually increase the amount of protein shake as tolerated     Sip fluids slowly and throughout the day   May use Sugar substitutes, use sparingly (limit to 6 - 8 packets per day). Your fluid goal is 64 ounces of fluid daily. It may take a few weeks to build up to this.         32 oz (or more) should be clear liquids and 32 oz (or more) should be full liquids.         Liquids should not contain sugar, caffeine, or carbonation! Acceptable Fluids Clear Liquids:   Water or Sugar-free flavored water, Fruit H2O   Decaffeinated coffee or tea (sugar-free)   Crystal Lite, Wyler's Lite, Minute Maid Lite   Sugar-free Jell-O   Bouillon or broth   Sugar-free Popsicle:   *Less than 20 calories each; Limit 1 per day   Full Liquids:              Protein Shakes/Drinks + 2 choices per day of  other full liquids shown below.    Other full liquids must be: No more than 12 grams of Carbs per serving,  No more than 3 grams of Fat per serving   Strained low-fat cream soup   Non-Fat milk   Fat-free Lactaid Milk   Sugar-free yogurt (Dannon Lite & Fit) Vitamins and Minerals (Start 1 day after surgery unless otherwise directed)   2 Chewable Multivitamin / Multimineral Supplement (i.e. Centrum for Adults)   Chewable Calcium Citrate with Vitamin D-3. Take 1500 mg each day.           (Example: 3 Chewable Calcium Plus 600 with Vitamin D-3 can be found at [facility])         Vitamin B-12, 350 - 500  micrograms (oral tablet) each day   Do not mix multivitamins containing iron with calcium supplements; take 2 hours   apart   Do not substitute Tums (calcium carbonate) for your calcium   Menstruating women and those at risk for anemia may need extra iron. Talk with your doctor to see if you need additional iron.    If you need extra iron:  Total daily Iron recommendations (including Vitamins) = 50 - 100 mg Iron/day Do not stop taking or change any vitamins or minerals until you talk to your nutritionist or surgeon. Your nutritionist and / or physician must approve all vitamin and mineral supplements. Exercise For maximum success, begin exercising as soon as your doctor recommends. Make sure your physician approves any physical activity.   Depending on fitness level, begin with a simple walking program   Walk 5-15 minutes each day, 7 days per week.    Slowly increase until you are walking 30-45 minutes per day   Consider joining our BELT program. [phone] or email [email or web address] [email or web address] Things to remember:    You may have sexual relations when you feel comfortable. It is VERY important for female patients to use a reliable birth control method. Fertility often increases after surgery. Do not get pregnant for at least 18 months.   It is very important to keep all follow up appointments with your surgeon,  nutritionist, primary care physician, and behavioral health practitioner. After the first year, please follow up with your bariatric surgeon at least once a year in order to maintain best weight loss results.  [facility]: [phone] [facility]: [phone]   Free counseling is available for you and your family through collaboration between [facility] and [place]. Please call [phone] and leave a message.    Consider purchasing a medical alert bracelet that says you had gastric bypass surgery.    [facility] has a free Bariatric Surgery Support Group that meets monthly, the 3rd Thursday, 6 pm, Classroom #1, [place]. You may register online at [email or web address], but registration is not necessary. Select Classes and Support Groups, Bariatric Surgery, or Call [phone]   Do not return to work or drive until cleared by your surgeon   Use your CPAP when sleeping if applicable   Do not lift anything greater than ten pounds for at least two weeks  Joen Laura, RN Bariatric Nurse  Coordinator

## 2012-08-17 ENCOUNTER — Encounter: Payer: BC Managed Care – PPO | Attending: General Surgery | Admitting: *Deleted

## 2012-08-17 DIAGNOSIS — Z713 Dietary counseling and surveillance: Secondary | ICD-10-CM | POA: Insufficient documentation

## 2012-08-17 DIAGNOSIS — Z01818 Encounter for other preprocedural examination: Secondary | ICD-10-CM | POA: Insufficient documentation

## 2012-08-18 ENCOUNTER — Encounter: Payer: Self-pay | Admitting: *Deleted

## 2012-08-18 NOTE — Patient Instructions (Signed)
Patient to follow Phase 3A-Soft, High Protein Diet and follow-up at [facility] in 6 weeks for 2 months post-op nutrition visit for diet advancement. 

## 2012-08-18 NOTE — Progress Notes (Signed)
Bariatric Class:  Appt start time: 1600 end time:  1700.  2 Week Post-Operative Nutrition Class  Patient was seen on 08/17/12 for Post-Operative Nutrition [facility].   Surgery date: 08/02/12  Surgery type: RYGB  Start weight at [facility]: 276.3 lbs (04/24/12)   Weight today: 247.0 lbs Weight change: 29.3 lbs Total weight lost: 29.3 lbs BMI: 42.4  TANITA  BODY COMP RESULTS  08/17/12   Fat Mass (lbs) 128.5   Fat Free Mass (lbs) 118.5   Total Body Water (lbs) 87.0   The following the learning objectives were met by the patient during this course:   Identifies Phase 3A (Soft, High Proteins) Dietary Goals and will begin from 2 weeks post-operatively to 2 months post-operatively  Identifies appropriate sources of fluids and proteins   States protein recommendations and appropriate sources post-operatively  Identifies the need for appropriate texture modifications, mastication, and bite sizes when consuming solids  Identifies appropriate multivitamin and calcium sources post-operatively  Describes the need for physical activity post-operatively and will follow MD recommendations  States when to call healthcare provider regarding medication questions or post-operative complications  Handouts given during class include:  Phase 3A: Soft, High Protein Diet Handout  Follow-Up Plan: Patient will follow-up at [facility] in 6 weeks for 2 months post-op nutrition visit for diet advancement per MD.

## 2012-08-19 ENCOUNTER — Other Ambulatory Visit: Payer: Self-pay | Admitting: Obstetrics & Gynecology

## 2012-08-19 DIAGNOSIS — Z1231 Encounter for screening mammogram for malignant neoplasm of breast: Secondary | ICD-10-CM

## 2012-08-20 ENCOUNTER — Ambulatory Visit (INDEPENDENT_AMBULATORY_CARE_PROVIDER_SITE_OTHER): Payer: BC Managed Care – PPO | Admitting: General Surgery

## 2012-08-20 VITALS — BP 148/82 | HR 100 | Temp 97.7°F | Resp 18 | Ht 64.0 in | Wt 246.0 lb

## 2012-08-20 DIAGNOSIS — Z4889 Encounter for other specified surgical aftercare: Secondary | ICD-10-CM

## 2012-08-20 DIAGNOSIS — Z5189 Encounter for other specified aftercare: Secondary | ICD-10-CM

## 2012-08-20 NOTE — Progress Notes (Signed)
Subjective:     Patient ID: Vickie Velazquez, female   DOB: 1966-03-30, 46 y.o.   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 She remains on restricted diet. She has started on some meats and vegetables and no difficulty with these.  She does feel like she has a hard time with water as though she needs to drink it so slow but solids okay.  She is carrying a water bottle and staying hydrated.  Not taking protein supplements but is taking vitamins and actigall and PPI.  Review of Systems     Objective:   Physical Exam NAD, nontoxic Abdomen is soft, NT, ND, wounds okay without any sign of infection    Assessment:     Status post endoscopic Roux-en-Y gastric bypass-doing well She seems to be doing very well from her procedure. A leaking that I could improve on would be increasing her protein intake. She is not taking her protein supplements due to the flavored but she has started with her meat intake and solids.  She is doing okay with the superior I recommend that she continue with her walking and increase her activity as tolerated. She has done well from weight loss standpoint. No evidence of any postop complications.     Plan:     Continue with restricted diet and advance as already outlined to her. I will see her back in about 3 weeks.

## 2012-09-10 ENCOUNTER — Ambulatory Visit (HOSPITAL_COMMUNITY)
Admission: RE | Admit: 2012-09-10 | Discharge: 2012-09-10 | Disposition: A | Discharge status: Home / Self Care | Payer: BC Managed Care – PPO | Source: Ambulatory Visit | Attending: Obstetrics & Gynecology | Admitting: Obstetrics & Gynecology

## 2012-09-10 DIAGNOSIS — Z1231 Encounter for screening mammogram for malignant neoplasm of breast: Secondary | ICD-10-CM | POA: Insufficient documentation

## 2012-09-23 ENCOUNTER — Encounter (INDEPENDENT_AMBULATORY_CARE_PROVIDER_SITE_OTHER): Payer: Self-pay | Admitting: General Surgery

## 2012-09-23 ENCOUNTER — Ambulatory Visit (INDEPENDENT_AMBULATORY_CARE_PROVIDER_SITE_OTHER): Payer: BC Managed Care – PPO | Admitting: General Surgery

## 2012-09-23 VITALS — BP 124/80 | HR 83 | Temp 97.1°F | Resp 16 | Ht 64.0 in | Wt 268.8 lb

## 2012-09-23 DIAGNOSIS — Z5189 Encounter for other specified aftercare: Secondary | ICD-10-CM

## 2012-09-23 DIAGNOSIS — K912 Postsurgical malabsorption, not elsewhere classified: Secondary | ICD-10-CM

## 2012-09-23 DIAGNOSIS — Z4889 Encounter for other specified surgical aftercare: Secondary | ICD-10-CM

## 2012-09-23 NOTE — Progress Notes (Signed)
Subjective:     Patient ID: Vickie Velazquez, female   DOB: 10/23/1965, 47 y.o.   MRN: [id number]  HPI This patient follows up one half months status post Roux-en-Y gastric bypass. She's doing very well and has lost about 30 pounds since her procedure. She is doing much better with her protein intake taking about 60 g per day. She is taking her vitamins as well as her Actigall and PPI. She is walking about one to 2 miles per day but she says that she still struggles with her fluid intake. She has had some occasional dizzy spells about twice since her surgery but otherwise denies any numbness or tingling or neurologic symptoms. She really has not had any dumping syndrome.  Review of Systems     Objective:   Physical Exam Her abdomen is soft and nontender exam her incisions are well-healed without sign of infection. She has no neurologic deficits.    Assessment:     Status post Roux-en-Y gastric bypass-doing well She seems to be doing very well from a weight loss and put and is doing much better with her protein intake. She is exercising  And seems to be making good food choices. I recommended that she really focus on staying hydrated and we will go ahead and check some nutrition labs will plan on seeing her back in May    Plan:     We will check some labs and I will see her back in May for repeat evaluation

## 2012-09-27 ENCOUNTER — Ambulatory Visit: Payer: BC Managed Care – PPO | Admitting: *Deleted

## 2012-09-28 ENCOUNTER — Encounter: Payer: Self-pay | Admitting: *Deleted

## 2012-09-28 ENCOUNTER — Encounter: Payer: BC Managed Care – PPO | Attending: General Surgery | Admitting: *Deleted

## 2012-09-28 VITALS — Ht 64.0 in | Wt 227.5 lb

## 2012-09-28 DIAGNOSIS — Z01818 Encounter for other preprocedural examination: Secondary | ICD-10-CM | POA: Insufficient documentation

## 2012-09-28 DIAGNOSIS — E669 Obesity, unspecified: Secondary | ICD-10-CM

## 2012-09-28 DIAGNOSIS — Z713 Dietary counseling and surveillance: Secondary | ICD-10-CM | POA: Insufficient documentation

## 2012-09-28 NOTE — Progress Notes (Signed)
Follow-up visit:  8 Weeks Post-Operative RYGB Surgery  Medical Nutrition Therapy:  Appt start time: 1700   End time:  1730.  Primary concerns today: Post-operative Bariatric Surgery Nutrition Management.  Surgery date: 08/02/12  Surgery type: RYGB  Start weight at [facility]: 276.3 lbs (04/24/12)   Weight today: 227.5 lbs Weight change: 19.5 lbs Total weight lost: 48.8 lbs  Goal weight: 150 lbs % goal met: 39%  TANITA  BODY COMP RESULTS  08/17/12 09/28/12   BMI (kg/m^2) 42.4 39.0   Fat Mass (lbs) 128.5 109.0   Fat Free Mass (lbs) 118.5 118.5   Total Body Water (lbs) 87.0 87.0   24-hr recall:  3 oz lean protein per meal; including cottage cheese, LF cheese (Baby Bel), etc.  Will obtain full dietary recall at next visit.  Fluid intake:  30-40 oz - if she takes more than a few swallows of fluids, it "sits there and is uncomfortable" Estimated total protein intake: 60-80 g  Medications: See medication list Supplementation: Taking B12 regularly; MVI once daily; Calcium only 2-3 times/week  Using straws: No Drinking while eating: No Hair loss: No Carbonated beverages: No N/V/D/C: Mild constipation, but clears on its own. Not tolerating eggs, protein shakes Dumping syndrome: No  Recent physical activity:  Walks 60 min on treadmill 4-5 days/week  Progress Towards Goal(s):  In progress.  Handouts given during visit include:  Phase 3B: High Protein + Non-Starchy Vegetables  Samples given during visit include:   Freedavite MVI: 2 bottles @ 5 tabs/bottle Lot # [id number]; Exp: 01/17   Nutritional Diagnosis:  [place]-3.3 Overweight/obesity related to past poor dietary habits and physical inactivity as evidenced by patient w/ recent RYGB surgery following dietary guidelines for continued weight loss.  Intervention:  Nutrition education/diet advancement.  Monitoring/Evaluation:  Dietary intake, exercise, lap band fills, and body weight. Follow up in 1 months for 3 month post-op visit.

## 2012-09-28 NOTE — Patient Instructions (Addendum)
Goals:  Follow Phase 3B: High Protein + Non-Starchy Vegetables  Eat 3-6 small meals/snacks, every 3-5 hrs  Increase lean protein foods to meet 60-80g goal  Increase fluid intake to 64oz +  Avoid drinking 15 minutes before, during and 30 minutes after eating  Aim for >30 min of physical activity daily  Try Celebrate Calcium Citrate Soft Chews ([facility]) and Dyke Brackett MVI   **COUPON FOR FREEDAVITE is "[facility]14" - good for $2 off a $10 order.  Expires at the end of March 2014.

## 2012-10-28 ENCOUNTER — Encounter: Payer: BC Managed Care – PPO | Attending: General Surgery | Admitting: *Deleted

## 2012-10-28 ENCOUNTER — Encounter: Payer: Self-pay | Admitting: *Deleted

## 2012-10-28 DIAGNOSIS — Z01818 Encounter for other preprocedural examination: Secondary | ICD-10-CM | POA: Insufficient documentation

## 2012-10-28 DIAGNOSIS — Z713 Dietary counseling and surveillance: Secondary | ICD-10-CM | POA: Insufficient documentation

## 2012-10-28 NOTE — Progress Notes (Signed)
Follow-up visit:  12 Weeks Post-Operative RYGB Surgery  Medical Nutrition Therapy:  Appt start time: 1630   End time:  1700.  Primary concerns today: Post-operative Bariatric Surgery Nutrition Management.  Surgery date: 08/02/12  Surgery type: RYGB  Start weight at [facility]: 276.3 lbs (04/24/12)   Weight today: 217.0 lbs Weight change: 10.5 lbs Total weight lost: 59.3 lbs  Goal weight: 150 lbs % goal met: 47%  TANITA  BODY COMP RESULTS  08/17/12 09/28/12 10/28/12   BMI (kg/m^2) 42.4 39.0 37.2   Fat Mass (lbs) 128.5 109.0 97.0   Fat Free Mass (lbs) 118.5 118.5 120.0   Total Body Water (lbs) 87.0 87.0 88.0   24-hr recall:  3 oz lean protein per meal; including cottage cheese, LF cheese (Baby Bel), eggs etc.   Fluid intake:  32-64 oz - continues to gradually increase Estimated total protein intake: 60-80 g  Medications: See medication list Supplementation: Taking regularly now  Using straws: No Drinking while eating: No Hair loss:  Mild Carbonated beverages: No N/V/D/C: Mild constipation, but clears on its own.  Dumping syndrome: No  Recent physical activity:  Walks 60 min on treadmill 4-5 days/week; has started to run some.  Progress Towards Goal(s):  In progress.  Nutritional Diagnosis:  [place]-3.3 Overweight/obesity related to past poor dietary habits and physical inactivity as evidenced by patient w/ recent RYGB surgery following dietary guidelines for continued weight loss.  Intervention:  Nutrition education/diet advancement.  Monitoring/Evaluation:  Dietary intake, exercise, and body weight. Follow up in 3 months for 6 month post-op visit.

## 2012-10-28 NOTE — Patient Instructions (Addendum)
Goals:  Follow Phase 3B: High Protein + Non-Starchy Vegetables  Eat 3-6 small meals/snacks, every 3-5 hrs  Increase lean protein foods to meet 60-80g goal  Increase fluid intake to 64oz +  Avoid drinking 15 minutes before, during and 30 minutes after eating  Aim for >30 min of physical activity daily 

## 2013-01-12 ENCOUNTER — Other Ambulatory Visit: Payer: Self-pay | Admitting: *Deleted

## 2013-01-12 MED ORDER — TEMAZEPAM 30 MG PO CAPS: 30.0000 mg | ORAL_CAPSULE | Freq: Every evening | ORAL | Status: DC | PRN

## 2013-01-12 NOTE — Telephone Encounter (Signed)
Faxed refill request received from pharmacy for [email or web address] Last filled by MD on 06/24/12, #30 X 5  Last AEX - 06/24/12 Next AEX - 08/11/12 Please advise refills.  Chart in your door.  Thanks.

## 2013-01-12 NOTE — Telephone Encounter (Signed)
RX faxed

## 2013-01-28 ENCOUNTER — Ambulatory Visit (INDEPENDENT_AMBULATORY_CARE_PROVIDER_SITE_OTHER): Payer: BC Managed Care – PPO | Admitting: General Surgery

## 2013-01-28 ENCOUNTER — Encounter (INDEPENDENT_AMBULATORY_CARE_PROVIDER_SITE_OTHER): Payer: Self-pay | Admitting: General Surgery

## 2013-01-28 VITALS — BP 134/86 | HR 68 | Temp 97.0°F | Resp 16 | Ht 64.0 in | Wt 187.2 lb

## 2013-01-28 DIAGNOSIS — Z9884 Bariatric surgery status: Secondary | ICD-10-CM

## 2013-01-28 DIAGNOSIS — K912 Postsurgical malabsorption, not elsewhere classified: Secondary | ICD-10-CM

## 2013-01-28 LAB — CBC WITH DIFFERENTIAL/PLATELET
Basophils Absolute: 0 10*3/uL (ref 0.0–0.1)
Basophils Relative: 0 % (ref 0–1)
Eosinophils Absolute: 0.2 10*3/uL (ref 0.0–0.7)
Eosinophils Relative: 3 % (ref 0–5)
HCT: 39.2 % (ref 36.0–46.0)
Hemoglobin: 13.1 g/dL (ref 12.0–15.0)
Lymphocytes Relative: 34 % (ref 12–46)
Lymphs Abs: 2.6 10*3/uL (ref 0.7–4.0)
MCH: 29.7 pg (ref 26.0–34.0)
MCHC: 33.4 g/dL (ref 30.0–36.0)
MCV: 88.9 fL (ref 78.0–100.0)
Monocytes Absolute: 0.7 10*3/uL (ref 0.1–1.0)
Monocytes Relative: 9 % (ref 3–12)
Neutro Abs: 4.2 10*3/uL (ref 1.7–7.7)
Neutrophils Relative %: 54 % (ref 43–77)
Platelets: 285 10*3/uL (ref 150–400)
RBC: 4.41 MIL/uL (ref 3.87–5.11)
RDW: 14.2 % (ref 11.5–15.5)
WBC: 7.8 10*3/uL (ref 4.0–10.5)

## 2013-01-28 LAB — COMPREHENSIVE METABOLIC PANEL
ALT: 19 U/L (ref 0–35)
AST: 22 U/L (ref 0–37)
Albumin: 4 g/dL (ref 3.5–5.2)
Alkaline Phosphatase: 66 U/L (ref 39–117)
BUN: 14 mg/dL (ref 6–23)
CO2: 28 mEq/L (ref 19–32)
Calcium: 9.6 mg/dL (ref 8.4–10.5)
Chloride: 104 mEq/L (ref 96–112)
Creat: 0.63 mg/dL (ref 0.50–1.10)
Glucose, Bld: 84 mg/dL (ref 70–99)
Potassium: 3.8 mEq/L (ref 3.5–5.3)
Sodium: 141 mEq/L (ref 135–145)
Total Bilirubin: 0.6 mg/dL (ref 0.3–1.2)
Total Protein: 6.4 g/dL (ref 6.0–8.3)

## 2013-01-28 LAB — IBC PANEL
%SAT: 25 % (ref 20–55)
TIBC: 306 ug/dL (ref 250–470)
UIBC: 228 ug/dL (ref 125–400)

## 2013-01-28 LAB — IRON: Iron: 78 ug/dL (ref 42–145)

## 2013-01-28 NOTE — Progress Notes (Signed)
Subjective:     Patient ID: Vickie Velazquez, female   DOB: 02/01/1966, 47 y.o.   MRN: [id number]  HPI This patient presents for 6 month followup after her gastric bypass. She has lost almost 100 pounds since her procedure and feels great. She says that she has more energy and is walking 3-4 miles a day. She has no food intolerance other than headaches. She has no dumping syndrome and is taking plenty of protein although she is not taking her protein supplements. She continues on her Protonix and Actigall. She has no abdominal pain. She denies any numbness or weakness or other neurologic symptoms. She has no complaints and is very satisfied with her outcome.  Review of Systems     Objective:   Physical Exam  no acute distress and nontoxic-appearing, sitting comfortably in a chair Abdomen normal    Assessment:     Obesity She has done very well from the procedure. She has lost about 60% of her excess body weight over the first 6 months and denies any significant problems.  She is very satisfied with her outcomes and continues to be active. She continues taking her vitamins and has no apparent complications. She has no evidence of any neurologic deficits.    Plan:     Continue with bariatric diet and exercise and we will check some nutrition labs and I will see her back in about 6 months at her one year followup. I told her that she can finish out her prescription for her PPIs and her Actigall and since her weight loss is slowing, she can come off of these when her prescription runs out.

## 2013-02-03 ENCOUNTER — Encounter: Payer: Self-pay | Admitting: *Deleted

## 2013-02-03 ENCOUNTER — Encounter: Payer: BC Managed Care – PPO | Attending: General Surgery | Admitting: *Deleted

## 2013-02-03 DIAGNOSIS — E669 Obesity, unspecified: Secondary | ICD-10-CM | POA: Insufficient documentation

## 2013-02-03 DIAGNOSIS — Z9884 Bariatric surgery status: Secondary | ICD-10-CM | POA: Insufficient documentation

## 2013-02-03 DIAGNOSIS — Z713 Dietary counseling and surveillance: Secondary | ICD-10-CM | POA: Insufficient documentation

## 2013-02-03 DIAGNOSIS — Z09 Encounter for follow-up examination after completed treatment for conditions other than malignant neoplasm: Secondary | ICD-10-CM | POA: Insufficient documentation

## 2013-02-03 NOTE — Progress Notes (Signed)
Follow-up visit:  6 Months Post-Operative RYGB Surgery  Medical Nutrition Therapy:  Appt start time: 1630   End time:  1700.  Primary concerns today: Post-operative Bariatric Surgery Nutrition Management. Doing great!  Taking only 1 dose of calcium because she cannot remember take them. No other problems reported.  Surgery date: 08/02/12  Surgery type: RYGB  Start weight at [facility]: 276.3 lbs (04/24/12)   Weight today: 184.5 lbs Weight change: 32.5 lbs Total weight lost: 91.8 lbs (57 lbs of FAT MASS)  Goal weight: 130-150 lbs (per pt, lowered goal to 130 lbs) % goal met: 63-73%  TANITA  BODY COMP RESULTS  08/17/12 09/28/12 10/28/12 02/03/13   BMI (kg/m^2) 42.4 39.0 37.2 31.7   Fat Mass (lbs) 128.5 109.0 97.0 71.5   Fat Free Mass (lbs) 118.5 118.5 120.0 113.0   Total Body Water (lbs) 87.0 87.0 88.0 82.5   24-hr recall:  Lots of beef jerky, 3 oz lean protein per meal; including cottage cheese, LF Baby Bel cheese; multigrain cheese puff  1/2 cup Ramen and Pad Thai noodles: 0 fat, <15 g CHO, 2-3 g protein = has for lunch Low CHO tomato basil tortilla with ham or [place], avocado, & babybel cheese   Fluid intake:  64 oz  Estimated total protein intake: 60-80 g  Medications: See medication list Supplementation: Taking regularly now  Using straws: No Drinking while eating: No Hair loss:  Resolving Carbonated beverages: No N/V/D/C: Mild constipation, but clears on its own or she eats "something small I'm not supposed to and it makes me go" Dumping syndrome: No  Recent physical activity:  Walks 60 min on treadmill 5 days/week  Progress Towards Goal(s):  In progress.  Nutritional Diagnosis:  [place]-3.3 Overweight/obesity related to past poor dietary habits and physical inactivity as evidenced by patient w/ recent RYGB surgery following dietary guidelines for continued weight loss.  Intervention:  Nutrition education/diet advancement.  Monitoring/Evaluation:  Dietary intake, exercise, and  body weight. Follow up in 6 months for 12 month post-op visit.

## 2013-02-03 NOTE — Patient Instructions (Addendum)
Goals:  Follow Phase 3B: High Protein + Non-Starchy Vegetables  Increase lean protein foods to meet 60-80g goal  Increase fluid intake to 64oz +  Add 15 grams of carbohydrate (fruit, whole grain, starchy vegetable) with meals  Avoid drinking 15 minutes before, during and 30 minutes after eating  Aim for >30 min of physical activity daily  TANITA  BODY COMP RESULTS  08/17/12 09/28/12 10/28/12 02/03/13   BMI (kg/m^2) 42.4 39.0 37.2 31.7   Fat Mass (lbs) 128.5 109.0 97.0 71.5   Fat Free Mass (lbs) 118.5 118.5 120.0 113.0   Total Body Water (lbs) 87.0 87.0 88.0 82.5    YOU GO GIRL!!!  CONGRATS!!

## 2013-06-30 ENCOUNTER — Other Ambulatory Visit: Payer: Self-pay | Admitting: Obstetrics & Gynecology

## 2013-07-01 NOTE — Telephone Encounter (Signed)
RX faxed

## 2013-07-01 NOTE — Telephone Encounter (Signed)
eScribe request for refill on TEMAZEPAM Last filled - 01/12/13, #30 X 5 Last AEX - 06/24/12 Next AEX - 08/11/13 Please advise refills.  Last filled in EPIC.  Thanks.

## 2013-07-14 ENCOUNTER — Other Ambulatory Visit: Payer: Self-pay

## 2013-07-29 ENCOUNTER — Encounter: Payer: Self-pay | Admitting: Obstetrics & Gynecology

## 2013-08-08 ENCOUNTER — Ambulatory Visit: Payer: BC Managed Care – PPO | Admitting: *Deleted

## 2013-08-10 ENCOUNTER — Other Ambulatory Visit: Payer: Self-pay | Admitting: Obstetrics & Gynecology

## 2013-08-10 DIAGNOSIS — Z1231 Encounter for screening mammogram for malignant neoplasm of breast: Secondary | ICD-10-CM

## 2013-08-11 ENCOUNTER — Ambulatory Visit: Payer: Self-pay | Admitting: Obstetrics & Gynecology

## 2013-08-18 ENCOUNTER — Ambulatory Visit: Payer: BC Managed Care – PPO | Admitting: *Deleted

## 2013-08-31 ENCOUNTER — Encounter: Payer: Self-pay | Admitting: Obstetrics & Gynecology

## 2013-09-05 ENCOUNTER — Encounter: Payer: BC Managed Care – PPO | Attending: General Surgery | Admitting: Dietician

## 2013-09-05 VITALS — Ht 64.0 in | Wt 144.0 lb

## 2013-09-05 DIAGNOSIS — E669 Obesity, unspecified: Secondary | ICD-10-CM

## 2013-09-05 DIAGNOSIS — Z713 Dietary counseling and surveillance: Secondary | ICD-10-CM | POA: Insufficient documentation

## 2013-09-05 NOTE — Progress Notes (Signed)
Follow-up visit:  6 Months Post-Operative RYGB Surgery  Medical Nutrition Therapy:  Appt start time: 1715   End time:  1745.  Primary concerns today: Post-operative Bariatric Surgery Nutrition Management. Doing great!  Florentina Addison returns with an additional 40.5 lbs of weight loss. Would like to lose another 10 lbs. Not taking calcium regularly since she forgets. Had a knee injury (torn meniscus and bursitis) and may need to have surgery so unable to exercise as much as she would like.    Surgery date: 08/02/12  Surgery type: RYGB  Start weight at [facility]: 276.3 lbs (04/24/12)   Weight today: 144.0 lbs Weight change: 40.5 lbs Total weight lost: 132.3 lbs  Goal weight: 130-150 lbs (per pt, lowered goal to 130 lbs) % goal met: 90%  TANITA  BODY COMP RESULTS  08/17/12 09/28/12 10/28/12 02/03/13 09/05/13   BMI (kg/m^2) 42.4 39.0 37.2 31.7 24.7   Fat Mass (lbs) 128.5 109.0 97.0 71.5 42.0   Fat Free Mass (lbs) 118.5 118.5 120.0 113.0 102.0   Total Body Water (lbs) 87.0 87.0 88.0 82.5 74.5   24-hr recall:  Lots of beef jerky, 3 oz lean protein per meal; including cottage cheese, LF Baby Bel cheese; multigrain cheese puff  1/2 cup Ramen and Pad Thai noodles: 0 fat, <15 g CHO, 2-3 g protein = has for lunch Low CHO tomato basil tortilla with ham or [place], avocado, & babybel cheese   Fluid intake:  64 oz  Estimated total protein intake: 60-80 g  Medications: See medication list Supplementation: Taking regularly now except calcium   Using straws: No Drinking while eating: No Hair loss:  Resolved Carbonated beverages: No soda N/V/D/C:  Will feel "stuck" if eats too quickly, will have diarrhea if eats sweets  Dumping syndrome:  A little if she eats fried foods or sweets Recent physical activity:  Walks and row 60 min on treadmill  and lifts weights everyday   Progress Towards Goal(s):  In progress.  Nutritional Diagnosis:  [place]-3.3 Overweight/obesity related to past poor dietary habits and physical  inactivity as evidenced by patient w/ recent RYGB surgery following dietary guidelines for continued weight loss.  Intervention:  Nutrition education/diet advancement.  Monitoring/Evaluation:  Dietary intake, exercise, and body weight. Follow up prn

## 2013-09-05 NOTE — Patient Instructions (Addendum)
Goals:  Follow Phase 3B: High Protein + Non-Starchy Vegetables  Increase lean protein foods to meet 60-80g goal  Increase fluid intake to 64oz +  Add 15 grams of carbohydrate (fruit, whole grain, starchy vegetable) with meals  Avoid drinking 15 minutes before, during and 30 minutes after eating  Aim for >30 min of physical activity daily  TANITA  BODY COMP RESULTS  08/17/12 09/28/12 10/28/12 02/03/13 09/05/13   BMI (kg/m^2) 42.4 39.0 37.2 31.7 24.7   Fat Mass (lbs) 128.5 109.0 97.0 71.5 42.0   Fat Free Mass (lbs) 118.5 118.5 120.0 113.0 102.0   Total Body Water (lbs) 87.0 87.0 88.0 82.5 74.5

## 2013-09-16 ENCOUNTER — Ambulatory Visit (HOSPITAL_COMMUNITY)
Admission: RE | Admit: 2013-09-16 | Discharge: 2013-09-16 | Disposition: A | Discharge status: Home / Self Care | Payer: BC Managed Care – PPO | Source: Ambulatory Visit | Attending: Obstetrics & Gynecology | Admitting: Obstetrics & Gynecology

## 2013-09-16 DIAGNOSIS — Z1231 Encounter for screening mammogram for malignant neoplasm of breast: Secondary | ICD-10-CM

## 2013-09-23 ENCOUNTER — Encounter: Payer: Self-pay | Admitting: Obstetrics & Gynecology

## 2013-09-26 ENCOUNTER — Ambulatory Visit: Payer: Self-pay | Admitting: Obstetrics & Gynecology

## 2013-10-10 ENCOUNTER — Encounter: Payer: Self-pay | Admitting: Obstetrics & Gynecology

## 2013-10-10 ENCOUNTER — Ambulatory Visit (INDEPENDENT_AMBULATORY_CARE_PROVIDER_SITE_OTHER): Payer: BC Managed Care – PPO | Admitting: Obstetrics & Gynecology

## 2013-10-10 VITALS — BP 102/62 | HR 64 | Resp 16 | Ht 64.0 in | Wt 138.2 lb

## 2013-10-10 DIAGNOSIS — Z01419 Encounter for gynecological examination (general) (routine) without abnormal findings: Secondary | ICD-10-CM

## 2013-10-10 DIAGNOSIS — R3129 Other microscopic hematuria: Secondary | ICD-10-CM

## 2013-10-10 DIAGNOSIS — Z1211 Encounter for screening for malignant neoplasm of colon: Secondary | ICD-10-CM

## 2013-10-10 DIAGNOSIS — N390 Urinary tract infection, site not specified: Secondary | ICD-10-CM

## 2013-10-10 DIAGNOSIS — Z0000 Encounter for general adult medical examination without abnormal findings: Secondary | ICD-10-CM

## 2013-10-10 DIAGNOSIS — R634 Abnormal weight loss: Secondary | ICD-10-CM

## 2013-10-10 LAB — POCT URINALYSIS DIPSTICK
Bilirubin, UA: NEGATIVE
Glucose, UA: NEGATIVE
Ketones, UA: NEGATIVE
Nitrite, UA: POSITIVE
Protein, UA: 1
Urobilinogen, UA: NEGATIVE
pH, UA: 5

## 2013-10-10 MED ORDER — CIPROFLOXACIN HCL 500 MG PO TABS: 500.0000 mg | ORAL_TABLET | Freq: Two times a day (BID) | ORAL | Status: DC

## 2013-10-10 NOTE — Progress Notes (Signed)
48 y.o. G4P4 MarriedCaucasianF here for annual exam.  Had bariatric surgery in Nov 2013.  Was 288.  Now 138.  Pt still losing weight.  Not rapid but this is concerning to her.  Dr. Lodema PilotBrian Layton did her surgery.  He is no longer with the practice--[facility].  Nutritionist released her in December.  Would like blood work done.    Patient's last menstrual period was 09/09/2007.          Sexually active: yes  The current method of family planning is vasectomy.    Exercising: yes  walking Smoker:  no  Health Maintenance: Pap:  06/14/12 WNL/negative HR HPV History of abnormal Pap:  yes MMG:  09/16/13 normal Colonoscopy:  none BMD:   none TDaP:  2013 Screening Labs: today, Hb today: today, Urine today: WBC-+2, NITRATE-positive, PROTEIN-+1, RBC-+1   reports that she has never smoked. She has never used smokeless tobacco. She reports that she drinks alcohol. She reports that she does not use illicit drugs.  Past Medical History  Diagnosis Date  . GERD (gastroesophageal reflux disease)   . Seasonal allergies   . Morbid obesity   . Plantar fasciitis     Patient-reported on 04/24/12  . Hiatal hernia     Patient reported on 04/24/12  . Migraine     with menses    Past Surgical History  Procedure Laterality Date  . Lateral collateral ligament repair, elbow  2008    lateral release-rt elbow  . Trigger finger release  10/16/2011    Procedure: RELEASE TRIGGER FINGER/A-1 PULLEY;  Surgeon: Tami RibasKevin R Kuzma, MD;  Location: [facility];  Service: Orthopedics;  Laterality: Right;  right small finger  . 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 Hernia repair  08/02/12    Current Outpatient Prescriptions  Medication Sig Dispense Refill  . Calcium Citrate-Vitamin D (CITRACAL PETITES/VITAMIN D) 200-250 MG-UNIT TABS Take 2 tablets by mouth 3  (three) times daily.      . Cyanocobalamin (VITAMIN B-12) 1000 MCG/15ML LIQD Take 1 mL by mouth daily.      . fluticasone (FLONASE) 50 MCG/ACT nasal spray Place into both nostrils daily.      . temazepam (RESTORIL) 30 MG capsule TAKE ONE CAPSULE BY MOUTH AT BEDTIME AS NEEDED FOR SLEEP  30 capsule  5  . estradiol-norethindrone (MIMVEY) 1-0.5 MG per tablet Take 1 tablet by mouth daily.      . Vitamin D, Ergocalciferol, (DRISDOL) 50000 UNITS CAPS Take 50,000 Units by mouth every 7 (seven) days.        No current facility-administered medications for this visit.    Family History  Problem Relation Age of Onset  . Anesthesia problems Mother     hard to wake up  . Cancer Mother     breast/thyroid  . Cancer Father     melanoma-3 years ago, new diag of skin cancer 1/15  . Cancer Brother     colon  . Cancer Maternal Aunt     breast  . Cancer Maternal Grandmother     breast  . Lupus Mother   . Diabetes Mother   . Diabetes Daughter   . Heart disease Father   . Graves' disease Mother   . Melanoma Maternal Uncle     ROS:  Pertinent items are noted in HPI.  Otherwise, a comprehensive  ROS was negative.  Exam:   BP 102/62  Pulse 64  Resp 16  Ht 5\' 4"  (1.626 m)  Wt 138 lb 3.2 oz (62.687 kg)  BMI 23.71 kg/m2  LMP 09/09/2007  Weight change: @WEIGHTCHANGE @ Height:   Height: 5\' 4"  (162.6 cm)  Ht Readings from Last 3 Encounters:  10/10/13 5\' 4"  (1.626 m)  09/05/13 5\' 4"  (1.626 m)  02/03/13 5\' 4"  (1.626 m)    General appearance: alert, cooperative and appears stated age Head: Normocephalic, without obvious abnormality, atraumatic Neck: no adenopathy, supple, symmetrical, trachea midline and thyroid normal to inspection and palpation Lungs: clear to auscultation bilaterally Breasts: normal appearance, no masses or tenderness Heart: regular rate and rhythm Abdomen: soft, non-tender; bowel sounds normal; no masses,  no organomegaly Extremities: extremities normal, atraumatic, no cyanosis  or edema Skin: Skin color, texture, turgor normal. No rashes or lesions Lymph nodes: Cervical, supraclavicular, and axillary nodes normal. No abnormal inguinal nodes palpated Neurologic: Grossly normal   Pelvic: External genitalia:  no lesions              Urethra:  normal appearing urethra with no masses, tenderness or lesions              Bartholins and Skenes: normal                 Vagina: normal appearing vagina with normal color and discharge, no lesions              Cervix: no lesions              Pap taken: no Bimanual Exam:  Uterus:  normal size, contour, position, consistency, mobility, non-tender              Adnexa: normal adnexa and no mass, fullness, tenderness               Rectovaginal: Confirms               Anus:  normal sphincter tone, no lesions  A:  Well Woman with normal exam Family hx of colon--brother age 38 Bariatric surgery 11/13--150 pounds weight loss  P:   Mammogram yearly. pap smear with neg HR HPV 10/13.  No pap today Cipro 500mg  bid x 7 days Urine culture pending B12, folate, iron profile, cmp, prealbumin, TSH with panel, vit d return annually or prn  An After Visit Summary was printed and given to the patient.

## 2013-10-10 NOTE — Patient Instructions (Signed)

## 2013-10-11 LAB — THYROID PANEL WITH TSH
Free Thyroxine Index: 3 (ref 1.0–3.9)
T3 Uptake: 33.1 % (ref 22.5–37.0)
T4, Total: 9.1 ug/dL (ref 5.0–12.5)
TSH: 1.944 u[IU]/mL (ref 0.350–4.500)

## 2013-10-11 LAB — COMPREHENSIVE METABOLIC PANEL
ALT: 12 U/L (ref 0–35)
AST: 14 U/L (ref 0–37)
Albumin: 4.3 g/dL (ref 3.5–5.2)
Alkaline Phosphatase: 53 U/L (ref 39–117)
BUN: 16 mg/dL (ref 6–23)
CO2: 28 mEq/L (ref 19–32)
Calcium: 9.4 mg/dL (ref 8.4–10.5)
Chloride: 106 mEq/L (ref 96–112)
Creat: 0.66 mg/dL (ref 0.50–1.10)
Glucose, Bld: 89 mg/dL (ref 70–99)
Potassium: 4 mEq/L (ref 3.5–5.3)
Sodium: 142 mEq/L (ref 135–145)
Total Bilirubin: 0.4 mg/dL (ref 0.2–1.2)
Total Protein: 6.5 g/dL (ref 6.0–8.3)

## 2013-10-11 LAB — HEMOGLOBIN, FINGERSTICK: Hemoglobin, fingerstick: 13.2 g/dL (ref 12.0–16.0)

## 2013-10-11 LAB — FERRITIN: Ferritin: 98 ng/mL (ref 10–291)

## 2013-10-11 LAB — VITAMIN D 25 HYDROXY (VIT D DEFICIENCY, FRACTURES): Vit D, 25-Hydroxy: 40 ng/mL (ref 30–89)

## 2013-10-11 LAB — PREALBUMIN: Prealbumin: 17.1 mg/dL (ref 17.0–34.0)

## 2013-10-11 LAB — IRON: Iron: 85 ug/dL (ref 42–145)

## 2013-10-11 NOTE — Addendum Note (Signed)
Addended by: Jerene BearsMILLER, Jameshia S on: 10/11/2013 05:14 AM   Modules accepted: Orders

## 2013-10-12 LAB — FOLATE: Folate: 13.4 ng/mL

## 2013-10-12 LAB — VITAMIN B12: Vitamin B-12: 1425 pg/mL — ABNORMAL HIGH (ref 211–911)

## 2013-10-13 LAB — URINE CULTURE: Colony Count: >=100000

## 2013-10-14 ENCOUNTER — Telehealth: Payer: Self-pay

## 2013-10-14 NOTE — Telephone Encounter (Signed)
Message copied by Elisha HeadlandNIX, Meggen Spaziani S on Fri Oct 14, 2013  4:05 PM ------      Message from: Jerene BearsMILLER, Kenza S      Created: Fri Oct 14, 2013  4:00 PM       Inform all labs normal.  Urine culture was + for E coli.  Finish abx.  TOC 2 weeks. ------

## 2013-10-14 NOTE — Addendum Note (Signed)
Addended by: Jerene BearsMILLER, Vivika S on: 10/14/2013 04:01 PM   Modules accepted: Orders

## 2013-10-14 NOTE — Telephone Encounter (Signed)
Patient notified of all results-see result note

## 2013-10-14 NOTE — Telephone Encounter (Signed)
lmtcb

## 2013-10-14 NOTE — Telephone Encounter (Signed)
Patient is calling kelly back °

## 2013-10-25 ENCOUNTER — Other Ambulatory Visit: Payer: Self-pay | Admitting: Obstetrics & Gynecology

## 2013-10-25 MED ORDER — ESZOPICLONE 3 MG PO TABS
3.0000 mg | ORAL_TABLET | Freq: Every day | ORAL | Status: DC
Start: 2013-10-25 — End: 2014-10-20

## 2013-10-31 ENCOUNTER — Ambulatory Visit (INDEPENDENT_AMBULATORY_CARE_PROVIDER_SITE_OTHER): Payer: BC Managed Care – PPO | Admitting: *Deleted

## 2013-10-31 DIAGNOSIS — N390 Urinary tract infection, site not specified: Secondary | ICD-10-CM

## 2013-11-01 LAB — URINE CULTURE: Colony Count: 65000

## 2014-01-04 ENCOUNTER — Other Ambulatory Visit: Payer: Self-pay | Admitting: Obstetrics & Gynecology

## 2014-01-05 NOTE — Telephone Encounter (Signed)
Last AEX 10/10/2013 Last refill 07/01/2013 #30/5 refills  Please approve or deny Rx.

## 2014-07-04 ENCOUNTER — Other Ambulatory Visit: Payer: Self-pay | Admitting: Obstetrics & Gynecology

## 2014-07-05 NOTE — Telephone Encounter (Signed)
Last refilled: 01/06/14 #30//5 refills Last AEX: 10/10/13 with Dr. Charissa BashMiller AEX Scheduled: 10/20/14 with Dr. Hyacinth MeekerMiller  Please Advise.

## 2014-07-06 NOTE — Telephone Encounter (Signed)
Rx has been faxed.

## 2014-08-14 ENCOUNTER — Other Ambulatory Visit: Payer: Self-pay | Admitting: Obstetrics & Gynecology

## 2014-08-14 DIAGNOSIS — Z1231 Encounter for screening mammogram for malignant neoplasm of breast: Secondary | ICD-10-CM

## 2014-09-22 ENCOUNTER — Ambulatory Visit (HOSPITAL_COMMUNITY)
Admission: RE | Admit: 2014-09-22 | Discharge: 2014-09-22 | Disposition: A | Discharge status: Home / Self Care | Payer: BLUE CROSS/BLUE SHIELD | Source: Ambulatory Visit | Attending: Obstetrics & Gynecology | Admitting: Obstetrics & Gynecology

## 2014-09-22 DIAGNOSIS — Z1231 Encounter for screening mammogram for malignant neoplasm of breast: Secondary | ICD-10-CM | POA: Insufficient documentation

## 2014-09-25 ENCOUNTER — Other Ambulatory Visit: Payer: Self-pay | Admitting: Obstetrics & Gynecology

## 2014-09-26 NOTE — Telephone Encounter (Signed)
Medication refill request: Temazepam 30 mg  Last AEX:  10/10/13 with Dr. Hyacinth MeekerMiller Next AEX: 10/20/14 with Dr. Hyacinth MeekerMiller Last MMG (if hormonal medication request): N/A Refill authorized: #30/1 rfs, please advise.

## 2014-09-27 NOTE — Telephone Encounter (Signed)
RX printed, signed By Dr. Hyacinth MeekerMiller and faxed to [facility].

## 2014-09-28 ENCOUNTER — Telehealth: Payer: Self-pay | Admitting: Obstetrics & Gynecology

## 2014-09-28 NOTE — Telephone Encounter (Signed)
This is fine for the override.  Thanks.

## 2014-09-28 NOTE — Telephone Encounter (Signed)
Called [facility] s/w Maralyn SagoSarah and confirmed override for patient's Temazepam prescription.  Routed to provider for review, encounter closed.

## 2014-09-28 NOTE — Telephone Encounter (Signed)
09/26/14 Temazepam 30 mg #30/1 rfs [facility]   [facility] they said they have the hard copy of the rx but patient is going out of town and she has requested refill too early last rx was refilled 09/03/14. Patient is going out [place] to [place] but they don't accept any controlled rx from outside states, is needing a verbal consent from our office saying it's okay for patient to have rx early so she can have it when she goes out of town.  Please advise.

## 2014-09-28 NOTE — Telephone Encounter (Signed)
Patient calling requesting assistance with a refill on Temazepam. She requests a call to the pharmacy as she will need to refill the medication early due to going out of town.  [facility] [place] [phone]

## 2014-10-20 ENCOUNTER — Encounter: Payer: Self-pay | Admitting: Obstetrics & Gynecology

## 2014-10-20 ENCOUNTER — Ambulatory Visit (INDEPENDENT_AMBULATORY_CARE_PROVIDER_SITE_OTHER): Payer: BLUE CROSS/BLUE SHIELD | Admitting: Obstetrics & Gynecology

## 2014-10-20 VITALS — BP 116/68 | HR 60 | Resp 12 | Ht 64.25 in | Wt 133.0 lb

## 2014-10-20 DIAGNOSIS — Z01419 Encounter for gynecological examination (general) (routine) without abnormal findings: Secondary | ICD-10-CM

## 2014-10-20 DIAGNOSIS — Z0000 Encounter for general adult medical examination without abnormal findings: Secondary | ICD-10-CM

## 2014-10-20 DIAGNOSIS — Z124 Encounter for screening for malignant neoplasm of cervix: Secondary | ICD-10-CM

## 2014-10-20 DIAGNOSIS — R6882 Decreased libido: Secondary | ICD-10-CM

## 2014-10-20 DIAGNOSIS — E538 Deficiency of other specified B group vitamins: Secondary | ICD-10-CM

## 2014-10-20 LAB — LIPID PANEL
Cholesterol: 154 mg/dL (ref 0–200)
HDL: 65 mg/dL (ref >39)
LDL Cholesterol: 79 mg/dL (ref 0–99)
Total CHOL/HDL Ratio: 2.4 Ratio
Triglycerides: 48 mg/dL (ref <150)
VLDL: 10 mg/dL (ref 0–40)

## 2014-10-20 LAB — COMPREHENSIVE METABOLIC PANEL
ALT: 12 U/L (ref 0–35)
AST: 18 U/L (ref 0–37)
Albumin: 4.1 g/dL (ref 3.5–5.2)
Alkaline Phosphatase: 60 U/L (ref 39–117)
BUN: 22 mg/dL (ref 6–23)
CO2: 29 mEq/L (ref 19–32)
Calcium: 9.3 mg/dL (ref 8.4–10.5)
Chloride: 106 mEq/L (ref 96–112)
Creat: 0.52 mg/dL (ref 0.50–1.10)
Glucose, Bld: 88 mg/dL (ref 70–99)
Potassium: 3.8 mEq/L (ref 3.5–5.3)
Sodium: 139 mEq/L (ref 135–145)
Total Bilirubin: 0.4 mg/dL (ref 0.2–1.2)
Total Protein: 6.7 g/dL (ref 6.0–8.3)

## 2014-10-20 LAB — TSH: TSH: 1.737 u[IU]/mL (ref 0.350–4.500)

## 2014-10-20 LAB — VITAMIN B12: Vitamin B-12: >2000 pg/mL — ABNORMAL HIGH (ref 211–911)

## 2014-10-20 NOTE — Progress Notes (Signed)
49 y.o. Vickie Velazquez MarriedCaucasianF here for annual exam.  Doing well.  Reports she has had some issue with low B12.  Was low at 356 and then in Nov was back to 649.  No vaginal bleeding.  Biggest issue for pt right now is libido.  Husband has lost weight as well with Bariatric Surgery.  He has lost 70 pounds.  PCP:  Geralynn Ochs.  Last labs were in late summer.    Patient's last menstrual period was 09/09/2007.          Sexually active: Yes.    The current method of family planning is vasectomy.    Exercising: Yes.    walking Smoker:  no  Health Maintenance: Pap:  06/14/12 WNL/negative HR HPV History of abnormal Pap:  no MMG:  09/22/14-normal Colonoscopy:  2015-repeat in 5 years, Dr. Loreta Ave BMD:   none TDaP:  2012 Screening Labs: today, Hb today: PCP, Urine today: PCP   reports that she has never smoked. She has never used smokeless tobacco. She reports that she drinks alcohol. She reports that she does not use illicit drugs.  Past Medical History  Diagnosis Date  . GERD (gastroesophageal reflux disease)   . Seasonal allergies   . Morbid obesity   . Plantar fasciitis     Patient-reported on 04/24/12  . Hiatal hernia     Patient reported on 04/24/12  . Migraine     with menses    Past Surgical History  Procedure Laterality Date  . Lateral collateral ligament repair, elbow  2008    lateral release-rt elbow  . Trigger finger release  10/16/2011    Procedure: RELEASE TRIGGER FINGER/A-1 PULLEY;  Surgeon: Tami Ribas, MD;  Location: [facility];  Service: Orthopedics;  Laterality: Right;  right small finger  . 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 Hernia repair  08/02/12    Current Outpatient Prescriptions  Medication Sig Dispense Refill  . Cyanocobalamin (VITAMIN B-12) 1000 MCG/15ML LIQD Take 1 mL by mouth daily.     . fluticasone (FLONASE) 50 MCG/ACT nasal spray Place into both nostrils daily.    . temazepam (RESTORIL) 30 MG capsule TAKE ONE CAPSULE BY MOUTH AT BEDTIME 30 capsule 1   No current facility-administered medications for this visit.    Family History  Problem Relation Age of Onset  . Anesthesia problems Mother     hard to wake up  . Cancer Mother     breast/thyroid  . Cancer Father     melanoma-3 years ago, new diag of skin cancer 1/15  . Cancer Brother     colon  . Cancer Maternal Aunt     breast  . Cancer Maternal Grandmother     breast  . Lupus Mother   . Diabetes Mother   . Diabetes Daughter   . Heart disease Father   . Graves' disease Mother   . Melanoma Maternal Uncle     ROS:  Pertinent items are noted in HPI.  Otherwise, a comprehensive ROS was negative.  Exam:   General appearance: alert, cooperative and appears stated age Head: Normocephalic, without obvious abnormality, atraumatic Neck: no adenopathy, supple, symmetrical, trachea midline and thyroid normal to inspection and palpation Lungs: clear to auscultation bilaterally Breasts: normal appearance, no masses or tenderness Heart: regular rate and rhythm Abdomen: soft, non-tender; bowel sounds normal;  no masses,  no organomegaly Extremities: extremities normal, atraumatic, no cyanosis or edema Skin: Skin color, texture, turgor normal. No rashes or lesions Lymph nodes: Cervical, supraclavicular, and axillary nodes normal. No abnormal inguinal nodes palpated Neurologic: Grossly normal   Pelvic: External genitalia:  no lesions              Urethra:  normal appearing urethra with no masses, tenderness or lesions              Bartholins and Skenes: normal                 Vagina: normal appearing vagina with normal color and discharge, no lesions              Cervix: no lesions              Pap taken: Yes.   Bimanual Exam:  Uterus:  normal size, contour, position, consistency, mobility, non-tender               Adnexa: normal adnexa and no mass, fullness, tenderness               Rectovaginal: Confirms               Anus:  normal sphincter tone, no lesions  Chaperone was present for exam.  A:  Well Woman with normal exam Family hx of colon--brother age 647 Bariatric surgery 11/13--150 pounds weight loss H/O B12 Decreased libido  P: Mammogram yearly. pap smear with neg HR HPV 10/13. Pap today. Testosterone level today B12 level today Testosterone level BMP, lipids, TSH, and Vit D return annually or prn

## 2014-10-21 LAB — VITAMIN D 25 HYDROXY (VIT D DEFICIENCY, FRACTURES): Vit D, 25-Hydroxy: 25 ng/mL — ABNORMAL LOW (ref 30–100)

## 2014-10-21 LAB — TESTOSTERONE: Testosterone: 16 ng/dL (ref 10–70)

## 2014-10-24 NOTE — Addendum Note (Signed)
Addended by: Jerene BearsMILLER, Eilish S on: 10/24/2014 12:18 PM   Modules accepted: Orders, SmartSet

## 2014-10-25 LAB — IPS PAP TEST WITH REFLEX TO HPV

## 2014-10-27 ENCOUNTER — Telehealth: Payer: Self-pay | Admitting: Obstetrics & Gynecology

## 2014-10-27 MED ORDER — NONFORMULARY OR COMPOUNDED ITEM: Status: DC

## 2014-10-27 NOTE — Telephone Encounter (Signed)
Spoke with patient. Advised patient of results as seen below. Patient is agreeable and verbalizes understanding. "If my B12 is in a normal range now. What does she think could be causing my dry eyes and burning in my mouth. Previously it was related to my B12 so now I do not know what to do." Advised would speak with Dr.Miller regarding this and return call to patient. Patient is agreeable. Rx for Testosterone 2% ointment [facility] as this is a compounded prescription.  Notes Recorded by Annamaria BootsMary Suzanne Miller, MD on 10/27/2014 at 1:45 PM Please inform pt that B12 is normal. Lipids are normal. TSH normal. CMP normal. Vit D 25. Was 40. Should supplement with 1000-2000 IU daily.   Testosterone is 16. Ok to start supplementation with topical Testosterone 2% ointment twice weekly. Recheck leverl 12 weeks. Orders placed.

## 2014-10-27 NOTE — Telephone Encounter (Signed)
Dr.Miller, please review and advise labs from 2/12.

## 2014-10-27 NOTE — Telephone Encounter (Signed)
Patient has some questions regarding her recent lab work she got on my chart

## 2014-10-27 NOTE — Addendum Note (Signed)
Addended by: Jerene BearsMILLER, Peony S on: 10/27/2014 01:49 PM   Modules accepted: Orders, SmartSet

## 2014-10-27 NOTE — Telephone Encounter (Signed)
There are several other disorders that can cause dry eyes and mouth burning.  Some of these are auto-immune.  Pt should start with PCP for additional evaluation.  Thanks.

## 2014-10-30 NOTE — Telephone Encounter (Signed)
Spoke with patient. Advised patient of message as seen below from Dr.Miller. Patient is agreeable and verbalizes understanding.  Routing to Dr.Silva for final review as Dr.Miller is out of the office.  Routing to provider for final review. Patient agreeable to disposition. Will close encounter

## 2014-12-04 ENCOUNTER — Other Ambulatory Visit: Payer: Self-pay | Admitting: Obstetrics & Gynecology

## 2014-12-04 NOTE — Telephone Encounter (Signed)
Medication refill request: Temazepam 30 mg  Last AEX:  10/20/14 with SM  Next AEX: 12/28/15 with SM  Last MMG (if hormonal medication request): 09/26/14 Bi-Rads 1: Negative Refill authorized: Please advise.

## 2014-12-05 NOTE — Telephone Encounter (Signed)
Rx printed, signed by Dr. Hyacinth MeekerMiller and [facility] by Tresa EndoKelly, CMA.

## 2015-01-12 ENCOUNTER — Other Ambulatory Visit: Payer: BLUE CROSS/BLUE SHIELD

## 2015-01-15 ENCOUNTER — Other Ambulatory Visit: Payer: BLUE CROSS/BLUE SHIELD

## 2015-01-22 ENCOUNTER — Other Ambulatory Visit (INDEPENDENT_AMBULATORY_CARE_PROVIDER_SITE_OTHER): Payer: BLUE CROSS/BLUE SHIELD

## 2015-01-22 DIAGNOSIS — R6882 Decreased libido: Secondary | ICD-10-CM

## 2015-01-23 LAB — TESTOSTERONE: Testosterone: 30 ng/dL (ref 10–70)

## 2015-01-29 ENCOUNTER — Telehealth: Payer: Self-pay

## 2015-01-29 NOTE — Telephone Encounter (Signed)
Lmtcb//kn 

## 2015-01-29 NOTE — Telephone Encounter (Signed)
Patient notified of all results-see result note//kn 

## 2015-01-29 NOTE — Telephone Encounter (Signed)
-----   Message from Jerene BearsMary S Miller, MD sent at 01/24/2015  4:57 PM EDT ----- Please call pt and inform level has gone up.  If feeling improvement, ok to stay at current dosing.  Does she need RF.  If still thinks there is room for improvement, can increase to one more day and week and repeat testosterone level in three months.  Thanks.  No mychart meessage sent.

## 2015-02-02 ENCOUNTER — Other Ambulatory Visit: Payer: Self-pay | Admitting: Obstetrics & Gynecology

## 2015-02-02 NOTE — Telephone Encounter (Signed)
Medication refill request: Restoril 30 mg  Last AEX:  10/20/14 with SM Next AEX: 12/28/15 with SM Last MMG (if hormonal medication request): n/a Refill authorized: Please advise.

## 2015-02-02 NOTE — Telephone Encounter (Signed)
Faxed to [facility].  

## 2015-04-07 ENCOUNTER — Other Ambulatory Visit: Payer: Self-pay | Admitting: Obstetrics & Gynecology

## 2015-04-09 NOTE — Telephone Encounter (Signed)
Medication refill request: Temazepam 30 mg Last AEX:  10/20/14 with SM Next AEX: 12/28/15 with SM  Last MMG (if hormonal medication request): 09/26/14 bi-rads 1: negative Refill authorized: #30/1 rfs?

## 2015-04-09 NOTE — Telephone Encounter (Signed)
Rx printed and faxed to [facility]. 

## 2015-04-12 ENCOUNTER — Other Ambulatory Visit: Payer: Self-pay | Admitting: Obstetrics & Gynecology

## 2015-04-13 NOTE — Telephone Encounter (Signed)
04/09/15 Restoril medication was faxed to [facility] # 30 with 1 refill.

## 2015-04-13 NOTE — Telephone Encounter (Deleted)
Medication refill request: Restoril Last AEX:  10/20/14 with SM Next AEX: 12/28/15 with SM Last MMG (if hormonal medication request):  Refill authorized: please advise.

## 2015-04-20 ENCOUNTER — Other Ambulatory Visit: Payer: BLUE CROSS/BLUE SHIELD

## 2015-06-10 ENCOUNTER — Other Ambulatory Visit: Payer: Self-pay | Admitting: Obstetrics & Gynecology

## 2015-06-11 NOTE — Telephone Encounter (Signed)
Rx faxed today 

## 2015-06-11 NOTE — Telephone Encounter (Signed)
Medication refill request: Temazepam  Last AEX:  10/20/14 SM Next AEX: 12/28/15 SM Last MMG (if hormonal medication request): 09/22/14 BIRADS1:neg Refill authorized: 04/09/15 #30caps/ 1R. Today please advise.   Routed to Dr. Oscar La

## 2015-07-03 ENCOUNTER — Telehealth (HOSPITAL_COMMUNITY): Payer: Self-pay

## 2015-07-03 NOTE — Telephone Encounter (Signed)
This patient is overdue for recommended follow-up with a bariatric surgeon at [facility]. A letter was mailed to the patient on 10.6.16 to the address on file from [facility] in attempt to re-establish care & advising the patient on the benefits of follow-up care and directing them to call [facility] at [phone] to schedule an appointment at their earliest convenience. The letter included a patient survey which [facility] today. Patient advised that she has achieved her goals & does not see the need for regular visits. Also included that her surgeon had left the practice. Survey sent to [facility] with request to reach out to patient advising she can be seen by their staff PA or another surgeon if she wishes.   Jim LikeAmanda T. [facility] Coordinator [phone]

## 2015-07-04 ENCOUNTER — Other Ambulatory Visit: Payer: Self-pay | Admitting: Obstetrics and Gynecology

## 2015-07-05 NOTE — Telephone Encounter (Signed)
Medication refill request: Restoril  Last AEX:  08/20/15 SM Next AEX: 12/28/15 SM Last MMG (if hormonal medication request): 09/26/14 BIRADS1:neg Refill authorized: 06/11/15 #30caps/ 0R. Today please advise.

## 2015-07-06 NOTE — Telephone Encounter (Signed)
Rx faxed

## 2015-08-30 ENCOUNTER — Other Ambulatory Visit: Payer: Self-pay | Admitting: Obstetrics & Gynecology

## 2015-08-30 NOTE — Telephone Encounter (Signed)
Medication refill request: Restoril 30 mg  Last AEX: 10/20/2014 SM Next AEX: 12/28/2015 SM Last MMG (if hormonal medication request): 09/22/2014 BIRADS 1 Negative Refill authorized: 07/06/2015 Restoril #30 tabs 1 Refill  Today: #30 tabs 1 Refill?

## 2015-10-30 ENCOUNTER — Other Ambulatory Visit: Payer: Self-pay | Admitting: Obstetrics & Gynecology

## 2015-10-30 NOTE — Telephone Encounter (Signed)
Medication refill request: Restoril Last AEX:  10/20/14 MSM Next AEX: 12/28/15 MSM Last MMG (if hormonal medication request): 09/22/14 BIRADS Category 1 Negative Refill authorized: 08/31/15 #30 caps 1 Refill  Today: #30 Capsules 1 Refill ? Please advise

## 2015-11-01 NOTE — Telephone Encounter (Signed)
Rx faxed today 

## 2015-12-28 ENCOUNTER — Other Ambulatory Visit: Payer: Self-pay | Admitting: Obstetrics & Gynecology

## 2015-12-28 ENCOUNTER — Encounter: Payer: Self-pay | Admitting: Obstetrics & Gynecology

## 2015-12-28 ENCOUNTER — Ambulatory Visit (INDEPENDENT_AMBULATORY_CARE_PROVIDER_SITE_OTHER): Payer: BLUE CROSS/BLUE SHIELD | Admitting: Obstetrics & Gynecology

## 2015-12-28 VITALS — BP 112/80 | HR 80 | Resp 16 | Ht 64.0 in | Wt 135.0 lb

## 2015-12-28 DIAGNOSIS — G4700 Insomnia, unspecified: Secondary | ICD-10-CM | POA: Diagnosis not present

## 2015-12-28 DIAGNOSIS — R8290 Unspecified abnormal findings in urine: Secondary | ICD-10-CM

## 2015-12-28 DIAGNOSIS — Z9884 Bariatric surgery status: Secondary | ICD-10-CM

## 2015-12-28 DIAGNOSIS — N390 Urinary tract infection, site not specified: Secondary | ICD-10-CM

## 2015-12-28 DIAGNOSIS — R790 Abnormal level of blood mineral: Secondary | ICD-10-CM

## 2015-12-28 DIAGNOSIS — Z01419 Encounter for gynecological examination (general) (routine) without abnormal findings: Secondary | ICD-10-CM | POA: Diagnosis not present

## 2015-12-28 DIAGNOSIS — Z0000 Encounter for general adult medical examination without abnormal findings: Secondary | ICD-10-CM

## 2015-12-28 LAB — POCT URINALYSIS DIPSTICK
Bilirubin, UA: NEGATIVE
Blood, UA: NEGATIVE
Glucose, UA: NEGATIVE
Ketones, UA: NEGATIVE
Nitrite, UA: POSITIVE
Protein, UA: NEGATIVE
Urobilinogen, UA: NEGATIVE
pH, UA: 5

## 2015-12-28 LAB — TSH: TSH: 2.79 mIU/L

## 2015-12-28 LAB — CBC
HCT: 37.5 % (ref 35.0–45.0)
Hemoglobin: 11.8 g/dL (ref 11.7–15.5)
MCH: 28 pg (ref 27.0–33.0)
MCHC: 31.5 g/dL — ABNORMAL LOW (ref 32.0–36.0)
MCV: 88.9 fL (ref 80.0–100.0)
MPV: 9.7 fL (ref 7.5–12.5)
Platelets: 274 10*3/uL (ref 140–400)
RBC: 4.22 MIL/uL (ref 3.80–5.10)
RDW: 13.4 % (ref 11.0–15.0)
WBC: 6.2 10*3/uL (ref 3.8–10.8)

## 2015-12-28 LAB — FOLATE: Folate: 10.5 ng/mL (ref >5.4)

## 2015-12-28 LAB — COMPREHENSIVE METABOLIC PANEL
ALT: 15 U/L (ref 6–29)
AST: 20 U/L (ref 10–35)
Albumin: 4.1 g/dL (ref 3.6–5.1)
Alkaline Phosphatase: 67 U/L (ref 33–130)
BUN: 16 mg/dL (ref 7–25)
CO2: 28 mmol/L (ref 20–31)
Calcium: 9.1 mg/dL (ref 8.6–10.4)
Chloride: 104 mmol/L (ref 98–110)
Creat: 0.75 mg/dL (ref 0.50–1.05)
Glucose, Bld: 100 mg/dL — ABNORMAL HIGH (ref 65–99)
Potassium: 4.4 mmol/L (ref 3.5–5.3)
Sodium: 142 mmol/L (ref 135–146)
Total Bilirubin: 0.3 mg/dL (ref 0.2–1.2)
Total Protein: 6.5 g/dL (ref 6.1–8.1)

## 2015-12-28 LAB — LIPID PANEL
Cholesterol: 149 mg/dL (ref 125–200)
HDL: 62 mg/dL (ref >=46)
LDL Cholesterol: 74 mg/dL (ref <130)
Total CHOL/HDL Ratio: 2.4 Ratio (ref <=5.0)
Triglycerides: 65 mg/dL (ref <150)
VLDL: 13 mg/dL (ref <30)

## 2015-12-28 LAB — FERRITIN: Ferritin: 6 ng/mL — ABNORMAL LOW (ref 10–232)

## 2015-12-28 LAB — VITAMIN B12: Vitamin B-12: 284 pg/mL (ref 200–1100)

## 2015-12-28 MED ORDER — TEMAZEPAM 30 MG PO CAPS: 30.0000 mg | ORAL_CAPSULE | Freq: Every evening | ORAL | Status: DC | PRN

## 2015-12-28 NOTE — Addendum Note (Signed)
Addended by: Jerene BearsMILLER, Aarna S on: 12/28/2015 02:42 PM   Modules accepted: Kipp BroodSmartSet

## 2015-12-28 NOTE — Addendum Note (Signed)
Addended by: Jerene BearsMILLER, Female S on: 12/28/2015 03:11 PM   Modules accepted: Kipp BroodSmartSet

## 2015-12-28 NOTE — Progress Notes (Addendum)
50 y.o. Vickie Velazquez MarriedCaucasianF here for annual exam.  Doing well.  CDC recommendations about Hep C discussed.  Pt is pretty sure she's had this.   Pt has questions about shingles vaccine.   Has significant issues with sleep.  Is using Restoril.    PCP:  [facility] [place].  Pt reports her PCP is leaving.    Patient's last menstrual period was 09/09/2007.          Sexually active: Yes.    The current method of family planning is post menopausal status.    Exercising: Yes.    Running, walking Smoker:  no  Health Maintenance: Pap:  10/20/14 Neg. 06/14/12 Neg. HR HPV:Neg History of abnormal Pap:  no MMG:  09/26/14 BIRADS1:neg Colonoscopy:  01/20/14 Repeat 5 years  BMD:   Never TDaP:  2012 Screening Labs: Here, Hb today: pending, Urine today: pending   reports that she has never smoked. She has never used smokeless tobacco. She reports that she drinks alcohol. She reports that she does not use illicit drugs.  Past Medical History  Diagnosis Date  . GERD (gastroesophageal reflux disease)   . Seasonal allergies   . Morbid obesity (HCC)   . Plantar fasciitis     Patient-reported on 04/24/12  . Hiatal hernia     Patient reported on 04/24/12  . Migraine     with menses    Past Surgical History  Procedure Laterality Date  . Lateral collateral ligament repair, elbow  2008    lateral release-rt elbow  . Trigger finger release  10/16/2011    Procedure: RELEASE TRIGGER FINGER/A-1 PULLEY;  Surgeon: Tami Ribas, MD;  Location: [facility];  Service: Orthopedics;  Laterality: Right;  right small finger  . 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 Hernia repair  08/02/12    Current Outpatient Prescriptions  Medication Sig Dispense Refill  . fluticasone (FLONASE) 50 MCG/ACT nasal spray Place into both nostrils daily.    .  SUMAtriptan (IMITREX) 20 MG/ACT nasal spray Place 1 spray into the nose.  5  . temazepam (RESTORIL) 30 MG capsule TAKE ONE CAPSULE BY MOUTH AT BEDTIME AS NEEDED 30 capsule 1   No current facility-administered medications for this visit.    Family History  Problem Relation Age of Onset  . Anesthesia problems Mother     hard to wake up  . Cancer Mother     breast/thyroid  . Cancer Father     melanoma-3 years ago, new diag of skin cancer 1/15  . Cancer Brother     colon  . Cancer Maternal Aunt     breast  . Cancer Maternal Grandmother     breast  . Lupus Mother   . Diabetes Mother   . Diabetes Daughter   . Heart disease Father   . Graves' disease Mother   . Melanoma Maternal Uncle     ROS:  Pertinent items are noted in HPI.  Otherwise, a comprehensive ROS was negative.  Exam:   BP 112/80 mmHg  Pulse 80  Resp 16  Ht  (1.626 m)  Wt 135 lb (61.236 kg)  BMI 23.16 kg/m2  LMP 09/09/2007  Weight change: +2#  Height:  (162.6 cm)  Ht Readings from Last 3 Encounters:  12/28/15  (1.626 m)  10/20/14 5' 4.25" (1.632 m)  10/10/13 5\' 4"  (1.626 m)    General appearance: alert, cooperative and appears stated age Head: Normocephalic, without obvious abnormality, atraumatic Neck: no adenopathy, supple, symmetrical, trachea midline and thyroid normal to inspection and palpation Lungs: clear to auscultation bilaterally Breasts: normal appearance, no masses or tenderness Heart: regular rate and rhythm Abdomen: soft, non-tender; bowel sounds normal; no masses,  no organomegaly Extremities: extremities normal, atraumatic, no cyanosis or edema Skin: Skin color, texture, turgor normal. No rashes or lesions Lymph nodes: Cervical, supraclavicular, and axillary nodes normal. No abnormal inguinal nodes palpated Neurologic: Grossly normal   Pelvic: External genitalia:  no lesions              Urethra:  normal appearing urethra with no masses, tenderness or lesions               Bartholins and Skenes: normal                 Vagina: normal appearing vagina with normal color and discharge, no lesions              Cervix: no lesions              Pap taken: No. Bimanual Exam:  Uterus:  normal size, contour, position, consistency, mobility, non-tender              Adnexa: normal adnexa and no mass, fullness, tenderness               Rectovaginal: Confirms               Anus:  normal sphincter tone, no lesions  Chaperone was present for exam.  A:  Well Woman with normal exam Family hx of colon--brother age 52 Bariatric surgery 11/13--150 pounds weight loss H/O B12 Decreased libido Insomnia.  Has used Ambien, Restoril and at least one other medication Abnormal dip urine today.  Asymptomatic.    P: Mammogram yearly.  Pt aware this is good. pap smear with neg HR HPV 10/13. last pap 2016.  No pap today. B12, thiamine, folate, ferritin, zinc.  (Pt has not had any bariatric surgery follow up in several year as her surgeon moved.)  Reviewed Up To Date recommendations about lab work. CBC, CMP, lipids, Vit D, TSH today Referral to Neurology done today. Urine culture pending. return annually or prn

## 2015-12-28 NOTE — Addendum Note (Signed)
Addended by: Dion BodyBELTRAN, Jerrell Mangel C on: 12/28/2015 04:26 PM   Modules accepted: Orders, SmartSet

## 2015-12-29 LAB — VITAMIN D 25 HYDROXY (VIT D DEFICIENCY, FRACTURES): Vit D, 25-Hydroxy: 32 ng/mL (ref 30–100)

## 2015-12-30 LAB — ZINC: Zinc: 61 ug/dL (ref 60–130)

## 2015-12-30 MED ORDER — NITROFURANTOIN MONOHYD MACRO 100 MG PO CAPS: 100.0000 mg | ORAL_CAPSULE | Freq: Two times a day (BID) | ORAL | Status: DC

## 2015-12-30 NOTE — Addendum Note (Signed)
Addended by: Jerene BearsMILLER, Anissa S on: 12/30/2015 08:45 PM   Modules accepted: Orders, SmartSet

## 2015-12-31 LAB — URINE CULTURE: Colony Count: >=100000

## 2016-01-01 ENCOUNTER — Telehealth: Payer: Self-pay | Admitting: Obstetrics & Gynecology

## 2016-01-01 NOTE — Telephone Encounter (Signed)
Left message to call Kaitlyn at [phone]. 

## 2016-01-01 NOTE — Telephone Encounter (Signed)
Patient has a question regarding a medication. °

## 2016-01-01 NOTE — Telephone Encounter (Signed)
Patient returning call.

## 2016-01-01 NOTE — Telephone Encounter (Signed)
Left message to call Vickie Velazquez at [phone].  After speaking with Dr.Jertson regarding recommendations [facility] who state they do not have Emfamil Fer In Sol iron supplement drops in stock, but that [facility] [address] [facility]. [facility] [address] who do not have this in stock, but have ordered this for the patient with enough for 1 month supply. [facility] will [facility] directly when this has arrived at the store which should be tomorrow per pharmacy tech.

## 2016-01-01 NOTE — Telephone Encounter (Signed)
Spoke with patient. Patient states that she is having trouble finding oral iron that was recommended by Dr.Miller (please see note below). She would like to know if she may take chewable Bariactiv Iron plus Vitamin C. There is 54 mg of Iron and 90 mg of Vitamin C per tablet. Reports the bottle states to take 3 tablets daily. "I am unable to take regular tablets because I have had bariatric surgery and they do not absorb well." Advised I will speak with covering provider as Dr.Miller is out of the office this week and return call with further recommendations. She is agreeable. 1 month follow up for repeat urine culture and ferritin level scheduled for 02/01/2016 at 2:30 pm. She is only available on Friday so I have placed her on Dr.Miller's schedule.  Notified pt of results via MyChart.  Pt note: Mrs. Vickie Velazquez, I wanted to let you know some of the results are back. Your cholesterol is great. Your thyroid function is normal. Your B12 and folate and Vit D are all in appropriate ranges.   Your metabolic panel showed your glucose to be 1 point above normal. I do not think you need any additional testing this year for this. We will just repeat the test at your next annual exam.  Your CBC was normal as well.  Your ferritin--your iron stores--are low. I would like you to try and OTC iron first. Fer-in-sol is a liquid drop form of iron. It's meant for infants/little ones but you may not absorb iron all that well and I'd like to see if this will work before having to consider and iron infusion (which is done through hematology). You should take 5ml a day. The bottle only contains 50ml so this will only last 10 days. The bottle is about $10. You should either put the 5ml (1 tsp) in some orange juice which helps absorption or drink some orange juice after you take it. It can cause constipation so if this occurs, let me know. I'd like you to try this for a month and then let's repeat the ferritin  level to see if improving. If it is, you can just keep using the drops but less frequently.   I've ordered the repeat level in a month. You will need to call and schedule the lab test.   The other tests aren't back yet but I'll send a message when they are. Please let me know if you have any questions.  Dr. Hyacinth MeekerMiller

## 2016-01-02 NOTE — Telephone Encounter (Signed)
Spoke with patient. Advised of message as seen below. She is agreeable and is aware [facility] [address] will contact her once the medication has come in and is ready for pick up.  Routing to provider for final review. Patient agreeable to disposition. Will close encounter.

## 2016-01-04 ENCOUNTER — Other Ambulatory Visit: Payer: Self-pay

## 2016-01-04 DIAGNOSIS — Z1231 Encounter for screening mammogram for malignant neoplasm of breast: Secondary | ICD-10-CM

## 2016-01-05 LAB — VITAMIN B1: Vitamin B1 (Thiamine): 17 nmol/L (ref 8–30)

## 2016-01-07 ENCOUNTER — Telehealth: Payer: Self-pay | Admitting: Obstetrics & Gynecology

## 2016-01-07 DIAGNOSIS — Z9884 Bariatric surgery status: Secondary | ICD-10-CM

## 2016-01-07 DIAGNOSIS — R790 Abnormal level of blood mineral: Secondary | ICD-10-CM

## 2016-01-07 NOTE — Telephone Encounter (Signed)
Patient was put on iron drops by Dr. Hyacinth MeekerMiller and they make her terribly ill, patient says Dr. Hyacinth MeekerMiller mentioned iron IV drops? Best # to reach: [phone]

## 2016-01-08 ENCOUNTER — Telehealth: Payer: Self-pay | Admitting: Obstetrics & Gynecology

## 2016-01-08 NOTE — Telephone Encounter (Signed)
Vickie BearsMary S Miller, MD at 01/08/2016 6:23 AM     Status: Signed       Expand All Collapse All   Order placed for referral to Dr. Myna HidalgoEnnever a hematologist. She will need to see him or his PA and the iron infusion (given via IV) will be done in his office. If she has not heard anything about the referral in a week, please have her call back. Thanks!            Lorraine LaxJasmine J Shaw, CMA at 01/07/2016 9:29 AM     Status: Signed       Expand All Collapse All   Patient was put on iron drops by Dr. Hyacinth MeekerMiller and they make her terribly ill, patient says Dr. Hyacinth MeekerMiller mentioned iron IV drops? Best # to reach: [phone]

## 2016-01-08 NOTE — Telephone Encounter (Signed)
Spoke with patient. Advised of message as seen below from Dr.Miller. She is agreeable and verbalizes understanding.  Routing to provider for final review. Patient agreeable to disposition. Will close encounter.  

## 2016-01-08 NOTE — Telephone Encounter (Signed)
Order placed for referral to Dr. Myna HidalgoEnnever a hematologist.  She will need to see him or his PA and the iron infusion (given via IV) will be done in his office.  If she has not heard anything about the referral in a week, please have her call back.  Thanks!

## 2016-01-09 ENCOUNTER — Ambulatory Visit: Payer: BLUE CROSS/BLUE SHIELD

## 2016-01-09 ENCOUNTER — Other Ambulatory Visit (HOSPITAL_BASED_OUTPATIENT_CLINIC_OR_DEPARTMENT_OTHER): Payer: BLUE CROSS/BLUE SHIELD

## 2016-01-09 ENCOUNTER — Other Ambulatory Visit: Payer: Self-pay | Admitting: Family

## 2016-01-09 ENCOUNTER — Ambulatory Visit (HOSPITAL_BASED_OUTPATIENT_CLINIC_OR_DEPARTMENT_OTHER): Payer: BLUE CROSS/BLUE SHIELD

## 2016-01-09 ENCOUNTER — Encounter: Payer: Self-pay | Admitting: Family

## 2016-01-09 ENCOUNTER — Ambulatory Visit (HOSPITAL_BASED_OUTPATIENT_CLINIC_OR_DEPARTMENT_OTHER): Payer: BLUE CROSS/BLUE SHIELD | Admitting: Family

## 2016-01-09 VITALS — BP 111/71 | HR 84 | Temp 98.3°F | Resp 16 | Ht 64.0 in | Wt 140.0 lb

## 2016-01-09 DIAGNOSIS — Z803 Family history of malignant neoplasm of breast: Secondary | ICD-10-CM | POA: Diagnosis not present

## 2016-01-09 DIAGNOSIS — K909 Intestinal malabsorption, unspecified: Secondary | ICD-10-CM | POA: Diagnosis not present

## 2016-01-09 DIAGNOSIS — Z9884 Bariatric surgery status: Secondary | ICD-10-CM

## 2016-01-09 DIAGNOSIS — D508 Other iron deficiency anemias: Secondary | ICD-10-CM

## 2016-01-09 DIAGNOSIS — D509 Iron deficiency anemia, unspecified: Secondary | ICD-10-CM

## 2016-01-09 HISTORY — DX: Bariatric surgery status: Z98.84

## 2016-01-09 HISTORY — DX: Iron deficiency anemia, unspecified: D50.9

## 2016-01-09 HISTORY — DX: Intestinal malabsorption, unspecified: K90.9

## 2016-01-09 LAB — CBC WITH DIFFERENTIAL (CANCER CENTER ONLY)
BASO#: 0 10*3/uL (ref 0.0–0.2)
BASO%: 0.4 % (ref 0.0–2.0)
EOS%: 3.4 % (ref 0.0–7.0)
Eosinophils Absolute: 0.2 10*3/uL (ref 0.0–0.5)
HCT: 37.1 % (ref 34.8–46.6)
HGB: 12 g/dL (ref 11.6–15.9)
LYMPH#: 2.1 10*3/uL (ref 0.9–3.3)
LYMPH%: 29.2 % (ref 14.0–48.0)
MCH: 29.4 pg (ref 26.0–34.0)
MCHC: 32.3 g/dL (ref 32.0–36.0)
MCV: 91 fL (ref 81–101)
MONO#: 0.6 10*3/uL (ref 0.1–0.9)
MONO%: 8 % (ref 0.0–13.0)
NEUT#: 4.2 10*3/uL (ref 1.5–6.5)
NEUT%: 59 % (ref 39.6–80.0)
Platelets: 261 10*3/uL (ref 145–400)
RBC: 4.08 10*6/uL (ref 3.70–5.32)
RDW: 13 % (ref 11.1–15.7)
WBC: 7 10*3/uL (ref 3.9–10.0)

## 2016-01-09 LAB — COMPREHENSIVE METABOLIC PANEL (CC13)
ALT: 13 IU/L (ref 0–32)
AST (SGOT): 18 IU/L (ref 0–40)
Albumin, Serum: 4.2 g/dL (ref 3.5–5.5)
Albumin/Globulin Ratio: 1.6 (ref 1.2–2.2)
Alkaline Phosphatase, S: 75 IU/L (ref 39–117)
BUN/Creatinine Ratio: 25 — ABNORMAL HIGH (ref 9–23)
BUN: 17 mg/dL (ref 6–24)
Bilirubin Total: 0.2 mg/dL (ref 0.0–1.2)
Calcium, Ser: 9.2 mg/dL (ref 8.7–10.2)
Carbon Dioxide, Total: 26 mmol/L (ref 18–29)
Chloride, Ser: 107 mmol/L — ABNORMAL HIGH (ref 96–106)
Creatinine, Ser: 0.69 mg/dL (ref 0.57–1.00)
GFR calc Af Amer: 116 mL/min/{1.73_m2} (ref >59)
GFR calc non Af Amer: 100 mL/min/{1.73_m2} (ref >59)
Globulin, Total: 2.6 g/dL (ref 1.5–4.5)
Glucose: 88 mg/dL (ref 65–99)
Potassium, Ser: 4.3 mmol/L (ref 3.5–5.2)
Sodium: 140 mmol/L (ref 134–144)
Total Protein: 6.8 g/dL (ref 6.0–8.5)

## 2016-01-09 LAB — CHCC SATELLITE - SMEAR

## 2016-01-09 MED ORDER — SODIUM CHLORIDE 0.9 % IV SOLN
Freq: Once | INTRAVENOUS | Status: AC
  Administered 2016-01-09: 16:00:00 via INTRAVENOUS

## 2016-01-09 MED ORDER — FERUMOXYTOL INJECTION 510 MG/17 ML
510.0000 mg | Freq: Once | INTRAVENOUS | Status: AC
  Administered 2016-01-09: 510 mg via INTRAVENOUS
  Filled 2016-01-09: qty 17

## 2016-01-09 NOTE — Telephone Encounter (Signed)
Left Message To Call Back  

## 2016-01-09 NOTE — Progress Notes (Signed)
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 Her ferritin level last week was down to 6. She is symptomatic at this time with fatigue, tremor in her hands, chewing ice and increased frequency of migraines. She has also had some dizziness, occasional palpitations and "swishing" sound in her ears when she is laying down. She takes Imitrex as prescribed and Excedrin Migraine PRN.  She is normally quite active walking 5 miles daily but lately has only been able to complete 3 before becoming too fatigued.  No family history of anemia.  There is a strong history of breast cancer in her mother's side of the family Database administrator, mother and sisters). Her mother and several aunts are currently enrolled in an NIH study in [place]. She is scheduled to have her annual mammogram this month on the 12th. Both her mother and father have a history of Melanoma. She wears sun screen and has not had any skin lesions appear.  She has 4 children and had no miscarriages. She still has all her female organs and states that she postmenopausal.  Her last colonoscopy was in May 2015 and she had several benign polyps removed.  No lymphadenopathy found on exam. No episodes of bleeding or bruising.  No fever, chills, n/v, cough, rash, SOB, chest pain, abdominal pain or changes in bowel or bladder habits.  No swelling, tenderness, numbness or tingling in her extremities. No c/o joint aches or pains.  She has maintained a good appetite and monitors her portions carefully. She is  staying well hydrated. Her weight is stable.   She does not smoke and only has a glass of wine on occasion.  She is originally from [place] but lived various places growing up with her dad in the army. She has lived here in [place] for over 10 years. 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   ROS: All other 10 point review of systems is negative.   PAST MEDICAL HISTORY:   Past Medical History  Diagnosis Date  . GERD (gastroesophageal reflux disease)   . Seasonal allergies   . Morbid obesity (HCC)   . Plantar fasciitis     Patient-reported on 04/24/12  . Hiatal hernia     Patient reported on 04/24/12  . Migraine     with menses    ALLERGIES: Allergies  Allergen Reactions  . Sulfa Antibiotics Rash      MEDICATIONS:  Current Outpatient Prescriptions on File Prior to Visit  Medication Sig Dispense Refill  . fluticasone (FLONASE) 50 MCG/ACT nasal spray Place into both nostrils daily.    . nitrofurantoin, macrocrystal-monohydrate, (MACROBID) 100 MG capsule Take 1 capsule (100 mg total) by mouth 2 (two) times daily. 14 capsule 0  . SUMAtriptan (IMITREX) 20 MG/ACT nasal spray Place 1 spray into the nose.  5  . temazepam (RESTORIL) 30 MG capsule Take 1 capsule (30 mg total) by mouth at bedtime as needed. 30 capsule 1   No current facility-administered medications on file prior to visit.  PAST SURGICAL HISTORY Past Surgical History  Procedure Laterality Date  . Lateral collateral ligament repair, elbow  2008    lateral release-rt elbow  . Trigger finger release  10/16/2011    Procedure: RELEASE TRIGGER FINGER/A-1 PULLEY;  Surgeon: Tami RibasKevin R Kuzma, MD;  Location: [facility];  Service: Orthopedics;  Laterality: Right;  right small finger  . 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 Hernia repair   08/02/12    FAMILY HISTORY: Family History  Problem Relation Age of Onset  . Anesthesia problems Mother     hard to wake up  . Cancer Mother     breast/thyroid  . Cancer Father     melanoma-3 years ago, new diag of skin cancer 1/15  . Cancer Brother     colon  . Cancer Maternal Aunt     breast  . Cancer Maternal Grandmother     breast  . Lupus Mother   . Diabetes Mother   . Diabetes Daughter   . Heart disease Father   . Graves' disease Mother   . Melanoma Maternal Uncle     SOCIAL HISTORY:  reports that she has never smoked. She has never used smokeless tobacco. She reports that she drinks alcohol. She reports that she does not use illicit drugs.  PERFORMANCE STATUS: The patient's performance status is 1 - Symptomatic but completely ambulatory  PHYSICAL EXAM: Most Recent Vital Signs: Last menstrual period 09/09/2007. LMP 09/09/2007  General Appearance:    Alert, cooperative, no distress, appears stated age  Head:    Normocephalic, without obvious abnormality, atraumatic  Eyes:    PERRL, conjunctiva/corneas clear, EOM's intact, fundi    benign, both eyes        Throat:   Lips, mucosa, and tongue normal; teeth and gums normal  Neck:   Supple, symmetrical, trachea midline, no adenopathy;    thyroid:  no enlargement/tenderness/nodules; no carotid   bruit or JVD  Back:     Symmetric, no curvature, ROM normal, no CVA tenderness  Lungs:     Clear to auscultation bilaterally, respirations unlabored  Chest Wall:    No tenderness or deformity   Heart:    Regular rate and rhythm, S1 and S2 normal, no murmur, rub   or gallop     Abdomen:     Soft, non-tender, bowel sounds active all four quadrants,    no masses, no organomegaly        Extremities:   Extremities normal, atraumatic, no cyanosis or edema  Pulses:   2+ and symmetric all extremities  Skin:   Skin color, texture, turgor normal, no rashes or lesions  Lymph nodes:   Cervical, supraclavicular, and axillary nodes  normal  Neurologic:   CNII-XII intact, normal strength, sensation and reflexes    throughout    LABORATORY DATA:  No results found for this or any previous visit (from the past 48 hour(s)).    RADIOGRAPHY: No results found.     PATHOLOGY: None  ASSESSMENT/PLAN: [name] is a very pleasant 50 yo white female with history of iron deficiency anemia since having a gastric bypass in 2013. Her ferritin last week was down to 6. She is symptomatic with fatigue, palpitations, tremor in hands, more frequent migraines, dizziness and "swishing" in her ears when laying down.  We will go ahead and give her  a dose of Feraheme today while she is here in our office. We will see what her iron studies show and bring her in for a second dose in 8 days if needed. We will plan to see her back in 6 weeks for repeat lab work and follow-up.  All questions were answered. She will contact us with any questions or concerns. We can certainly see her much sooner if necessary.  She was discussed with and also seen by Dr. Myna Hidalgo and he is in agreement with the aforementioned.   [facility]     Addendum:  I saw and examined the patient with Carlisle Enke. I looked at her blood smear. 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 She has some hypochromic red cells. There are no nuclear red blood cells. No teardrop cells. She has no schistocytes a sites. Her white cells been normal in morphology maturation. She has no immature myeloid or lymphoid forms. Platelets are adequate in number and size.  Given that she has had the gastric bypass, I just don't think that she is absorbing iron. As such, I believe that she will benefit from IV iron.  We also be careful with vitamin B-12 deficiency. Again, given that she had her gastric bypass 4 years ago, she might be starting to get to the period in which she has pernicious anemia.  The family history breast cancer certainly interesting. Sounds like she has several family  members on a clinical study.  Her exam was very much unremarkable.  I really think that IV I will help her out. Again, with a gastric bypass, she just is not going to absorb any iron.  We will give her a couple doses of IV iron. And we will plan to get her back to see her in another 6 weeks.  We spent about 45 minutes with her. We answered all her questions.  Hewitt Shorts

## 2016-01-09 NOTE — Telephone Encounter (Signed)
Patient called back she is seeing Dr. Myna HidalgoEnnever today and patient wanted to say thank you for doing this for her!  Routed to provider for review, encounter closed.

## 2016-01-09 NOTE — Patient Instructions (Signed)

## 2016-01-10 LAB — ERYTHROPOIETIN: Erythropoietin: 11.3 m[IU]/mL (ref 2.6–18.5)

## 2016-01-10 LAB — IRON AND TIBC
%SAT: 22 % (ref 21–57)
Iron: 82 ug/dL (ref 41–142)
TIBC: 376 ug/dL (ref 236–444)
UIBC: 295 ug/dL (ref 120–384)

## 2016-01-10 LAB — HEMOGLOBINOPATHY EVALUATION
HGB C: 0 %
HGB S: 0 %
Hemoglobin A2 Quantitation: 2.4 % (ref 0.7–3.1)
Hemoglobin F Quantitation: 0 % (ref 0.0–2.0)
Hgb A: 97.6 % (ref 94.0–98.0)

## 2016-01-10 LAB — FERRITIN: Ferritin: 10 ng/ml (ref 9–269)

## 2016-01-10 LAB — VITAMIN B12: Vitamin B12: 224 pg/mL (ref 211–946)

## 2016-01-10 LAB — RETICULOCYTES: Reticulocyte Count: 0.5 % — ABNORMAL LOW (ref 0.6–2.6)

## 2016-01-11 ENCOUNTER — Telehealth: Payer: Self-pay | Admitting: Nurse Practitioner

## 2016-01-11 NOTE — Telephone Encounter (Addendum)
LVM to call back and schedule a appointment.----- Message from Verdie MosherSarah M [place], NP sent at 01/10/2016  1:24 PM EDT ----- Regarding: iron Ferritin is still low. She will need a second dose of Feraheme in 8 days. Thank you!!!  Sarah  ----- Message -----    From: [facility]    Sent: 01/09/2016   2:28 PM      To: Verdie MosherSarah M [place], NP

## 2016-01-17 ENCOUNTER — Ambulatory Visit (HOSPITAL_BASED_OUTPATIENT_CLINIC_OR_DEPARTMENT_OTHER): Payer: BLUE CROSS/BLUE SHIELD

## 2016-01-17 VITALS — BP 101/64 | HR 66 | Temp 98.2°F | Resp 18

## 2016-01-17 DIAGNOSIS — Z9884 Bariatric surgery status: Secondary | ICD-10-CM

## 2016-01-17 DIAGNOSIS — D508 Other iron deficiency anemias: Secondary | ICD-10-CM | POA: Diagnosis not present

## 2016-01-17 DIAGNOSIS — K909 Intestinal malabsorption, unspecified: Secondary | ICD-10-CM | POA: Diagnosis not present

## 2016-01-17 DIAGNOSIS — D509 Iron deficiency anemia, unspecified: Secondary | ICD-10-CM

## 2016-01-17 MED ORDER — SODIUM CHLORIDE 0.9 % IV SOLN
Freq: Once | INTRAVENOUS | Status: AC
  Administered 2016-01-17: 15:00:00 via INTRAVENOUS

## 2016-01-17 MED ORDER — SODIUM CHLORIDE 0.9 % IV SOLN
510.0000 mg | Freq: Once | INTRAVENOUS | Status: AC
  Administered 2016-01-17: 510 mg via INTRAVENOUS
  Filled 2016-01-17: qty 17

## 2016-01-17 NOTE — Patient Instructions (Signed)

## 2016-01-18 ENCOUNTER — Ambulatory Visit
Admission: RE | Admit: 2016-01-18 | Discharge: 2016-01-18 | Disposition: A | Discharge status: Home / Self Care | Payer: BLUE CROSS/BLUE SHIELD | Source: Ambulatory Visit

## 2016-01-18 DIAGNOSIS — Z1231 Encounter for screening mammogram for malignant neoplasm of breast: Secondary | ICD-10-CM

## 2016-01-21 ENCOUNTER — Telehealth: Payer: Self-pay | Admitting: Obstetrics & Gynecology

## 2016-01-21 MED ORDER — FLUCONAZOLE 150 MG PO TABS: ORAL_TABLET | ORAL | Status: DC

## 2016-01-21 NOTE — Telephone Encounter (Signed)
Spoke with patient. Patient states that she began to have vaginal itching, discharge, and slight vaginal burning on 01/20/2016. Reports vaginal itching has increased today. Denies the use of any new detergent or soap. Patient was treated for UTI on 12/30/2015 with Macrobid 100 mg x 7 days. Patient completed the prescription on 01/07/2016. Reports her symptoms feel like a yeast infection, but is not completely sure "It has been a long time since I have had a yeast infection." Patient is going out of town tomorrow because her mother is having surgery on 01/22/2016. She is scheduled for a follow up urine culture and lab work on 5/26 with Dr.Miller.Patient is asking what Dr.Miller recommends she do. Asking if she may need an rx for a yeast infection. Advised I will speak with Dr.Miller regarding symptoms and return call with further recommendations. She is agreeable.

## 2016-01-21 NOTE — Telephone Encounter (Signed)
Spoke with patient. Notified of message as seen below from Dr.Miller. She is agreeable and verbalizes understanding. Will close encounter.

## 2016-01-21 NOTE — Telephone Encounter (Signed)
Ok for pt to take Diflucan 150mg  po x 1, repeat 72 hrs if needed.  If symptoms don't resolve, I'd like to see her.  Ok to close encounter.

## 2016-01-21 NOTE — Telephone Encounter (Signed)
Patient having some itching and burning and would like to speak with the nurse before scheduling an appointment.

## 2016-02-01 ENCOUNTER — Encounter: Payer: Self-pay | Admitting: Obstetrics & Gynecology

## 2016-02-01 ENCOUNTER — Ambulatory Visit (INDEPENDENT_AMBULATORY_CARE_PROVIDER_SITE_OTHER): Payer: BLUE CROSS/BLUE SHIELD | Admitting: Obstetrics & Gynecology

## 2016-02-01 VITALS — BP 104/60 | HR 76 | Resp 16 | Wt 139.4 lb

## 2016-02-01 DIAGNOSIS — N949 Unspecified condition associated with female genital organs and menstrual cycle: Secondary | ICD-10-CM | POA: Diagnosis not present

## 2016-02-01 DIAGNOSIS — N390 Urinary tract infection, site not specified: Secondary | ICD-10-CM | POA: Diagnosis not present

## 2016-02-01 DIAGNOSIS — R790 Abnormal level of blood mineral: Secondary | ICD-10-CM

## 2016-02-01 LAB — CBC
HCT: 37.9 % (ref 35.0–45.0)
Hemoglobin: 12.1 g/dL (ref 11.7–15.5)
MCH: 28.7 pg (ref 27.0–33.0)
MCHC: 31.9 g/dL — ABNORMAL LOW (ref 32.0–36.0)
MCV: 90 fL (ref 80.0–100.0)
MPV: 9.4 fL (ref 7.5–12.5)
Platelets: 258 10*3/uL (ref 140–400)
RBC: 4.21 MIL/uL (ref 3.80–5.10)
RDW: 14.4 % (ref 11.0–15.0)
WBC: 6.1 10*3/uL (ref 3.8–10.8)

## 2016-02-01 LAB — FERRITIN: Ferritin: 246 ng/mL — ABNORMAL HIGH (ref 10–232)

## 2016-02-01 LAB — IRON: Iron: 139 ug/dL (ref 45–160)

## 2016-02-01 NOTE — Progress Notes (Signed)
GYNECOLOGY  VISIT    HPI: 50 y.o. G22P4 Married Caucasian female with complaint of vaginal burning and itching that has been present for several weeks.  Took Diflucan last week and this didn't do anything.  She was traveling so called for the prescription.  Has not tried anything else.  Denies vaginal discharge or vaginal bleeding.  Pt did see Emeline Gins at Dr. Gustavo Lah office.  She's had two iron transfusions.  States she was having daily headaches before the iron infusions and this seems to have completely stopped.  She can't believe these two were related.  Is supposed to have some blood work follow-up with Maralyn Sago.  I can see orders so will placed these today as pt is also going to have repeat ferritin today.  Lastly, pt had dip u/a on 12/28/15 at [facility] showing nitrites.  She was asymptomatic.  Urine culture was obtained showing e coli UTI.  Pt treated with Macrobid  bid x 7 days.  Reports she feels fine.  Has no dysuria, hematuria, back pain or fever.  GYNECOLOGIC HISTORY: Patient's last menstrual period was 09/09/2007. Contraception: PMP  Patient Active Problem List   Diagnosis Date Noted  . Iron deficiency anemia 01/09/2016  . 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 Personal history of gastric bypass 01/09/2016    Past Medical History  Diagnosis Date  . GERD (gastroesophageal reflux disease)   . Seasonal allergies   . Morbid obesity (HCC)   . Plantar fasciitis     Patient-reported on 04/24/12  . Hiatal hernia     Patient reported on 04/24/12  . Migraine     with menses  . Iron deficiency anemia 01/09/2016  . 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 Personal history of gastric bypass 01/09/2016    Past Surgical History  Procedure Laterality Date  . Lateral collateral ligament repair, elbow  2008    lateral release-rt elbow  . Trigger finger release  10/16/2011    Procedure: RELEASE TRIGGER FINGER/A-1 PULLEY;  Surgeon: Tami Ribas, MD;  Location: [facility];  Service:  Orthopedics;  Laterality: Right;  right small finger  . 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 Hernia repair  08/02/12    MEDS:  Reviewed in EPIC and UTD  ALLERGIES: Sulfa antibiotics  Family History  Problem Relation Age of Onset  . Anesthesia problems Mother     hard to wake up  . Cancer Mother     breast/thyroid  . Cancer Father     melanoma-3 years ago, new diag of skin cancer 1/15  . Cancer Brother     colon  . Cancer Maternal Aunt     breast  . Cancer Maternal Grandmother     breast  . Lupus Mother   . Diabetes Mother   . Diabetes Daughter   . Heart disease Father   . Graves' disease Mother   . Melanoma Maternal Uncle     SH:  Married, non smoker  Review of Systems  All other systems reviewed and are negative.   PHYSICAL EXAMINATION:    BP 104/60 mmHg  Pulse 76  Resp 16  Wt 139 lb 6.4 oz (63.231 kg)  LMP 09/09/2007    General appearance: alert, cooperative and appears stated age Abdomen: soft, non-tender; bowel sounds normal; no masses,  no organomegaly  Pelvic: External genitalia:  no lesions  Urethra:  normal appearing urethra with no masses, tenderness or lesions              Bartholins and Skenes: normal                 Vagina: normal appearing vagina with normal color and discharge, no lesions              Cervix: no lesions              Bimanual Exam:  Uterus:  normal size, contour, position, consistency, mobility, non-tender              Adnexa: no mass, fullness, tenderness              Anus:  normal sphincter tone, no lesions  Chaperone was present for exam.  Assessment: Vaginal burning/irritation Low ferritin with hx of bariatric surgery H/O e coli UTI  Plan: CBC, iron, ferritin Urine culture Affirm pending.  If negative, will start vaginal estrogen cream for symptom  improvement

## 2016-02-02 LAB — WET PREP BY MOLECULAR PROBE
Candida species: NEGATIVE
Gardnerella vaginalis: NEGATIVE
Trichomonas vaginosis: NEGATIVE

## 2016-02-03 LAB — URINE CULTURE: Colony Count: 95000

## 2016-02-13 MED ORDER — ESTROGENS, CONJUGATED 0.625 MG/GM VA CREA: TOPICAL_CREAM | VAGINAL | Status: DC

## 2016-02-13 NOTE — Addendum Note (Signed)
Addended by: Jerene BearsMILLER, Annaclaire S on: 02/13/2016 09:15 AM   Modules accepted: Orders

## 2016-02-20 ENCOUNTER — Ambulatory Visit: Payer: BLUE CROSS/BLUE SHIELD | Admitting: Family

## 2016-02-20 ENCOUNTER — Other Ambulatory Visit: Payer: BLUE CROSS/BLUE SHIELD

## 2016-02-22 ENCOUNTER — Other Ambulatory Visit: Payer: BLUE CROSS/BLUE SHIELD

## 2016-02-22 ENCOUNTER — Ambulatory Visit: Payer: BLUE CROSS/BLUE SHIELD | Admitting: Family

## 2016-03-02 ENCOUNTER — Other Ambulatory Visit: Payer: Self-pay | Admitting: Obstetrics & Gynecology

## 2016-03-03 NOTE — Telephone Encounter (Signed)
Medication refill request: Temazepam 30mg  Last AEX:  12/28/15 SM Next AEX: 04/10/17 Last MMG (if hormonal medication request): 01/18/16 [email or web address] Refill authorized: Temazepam 30mg  #30 1R. Please advise. Thank you.

## 2016-04-07 ENCOUNTER — Other Ambulatory Visit: Payer: Self-pay | Admitting: Obstetrics & Gynecology

## 2016-04-08 NOTE — Telephone Encounter (Signed)
Medication refill request: Temazepam 15mg  Last AEX:  12/28/15 SM Next AEX: 04/10/17 Last MMG (if hormonal medication request): 01/18/16 BIRADS1 negative Refill authorized: 03/04/16 #30 w/1refill; today please advise

## 2016-04-16 ENCOUNTER — Other Ambulatory Visit (HOSPITAL_BASED_OUTPATIENT_CLINIC_OR_DEPARTMENT_OTHER): Payer: BLUE CROSS/BLUE SHIELD

## 2016-04-16 ENCOUNTER — Encounter: Payer: Self-pay | Admitting: Family

## 2016-04-16 ENCOUNTER — Ambulatory Visit (HOSPITAL_BASED_OUTPATIENT_CLINIC_OR_DEPARTMENT_OTHER): Payer: BLUE CROSS/BLUE SHIELD | Admitting: Family

## 2016-04-16 VITALS — BP 118/65 | HR 63 | Temp 98.0°F | Resp 18 | Ht 64.0 in | Wt 141.0 lb

## 2016-04-16 DIAGNOSIS — Z9884 Bariatric surgery status: Secondary | ICD-10-CM

## 2016-04-16 DIAGNOSIS — D508 Other iron deficiency anemias: Secondary | ICD-10-CM

## 2016-04-16 DIAGNOSIS — K909 Intestinal malabsorption, unspecified: Secondary | ICD-10-CM

## 2016-04-16 DIAGNOSIS — D509 Iron deficiency anemia, unspecified: Secondary | ICD-10-CM

## 2016-04-16 LAB — CBC WITH DIFFERENTIAL (CANCER CENTER ONLY)
BASO#: 0 10*3/uL (ref 0.0–0.2)
BASO%: 0.3 % (ref 0.0–2.0)
EOS%: 1.8 % (ref 0.0–7.0)
Eosinophils Absolute: 0.1 10*3/uL (ref 0.0–0.5)
HCT: 36.9 % (ref 34.8–46.6)
HGB: 12 g/dL (ref 11.6–15.9)
LYMPH#: 1.7 10*3/uL (ref 0.9–3.3)
LYMPH%: 28.6 % (ref 14.0–48.0)
MCH: 31.3 pg (ref 26.0–34.0)
MCHC: 32.5 g/dL (ref 32.0–36.0)
MCV: 96 fL (ref 81–101)
MONO#: 0.4 10*3/uL (ref 0.1–0.9)
MONO%: 7.1 % (ref 0.0–13.0)
NEUT#: 3.8 10*3/uL (ref 1.5–6.5)
NEUT%: 62.2 % (ref 39.6–80.0)
Platelets: 238 10*3/uL (ref 145–400)
RBC: 3.83 10*6/uL (ref 3.70–5.32)
RDW: 13.3 % (ref 11.1–15.7)
WBC: 6.1 10*3/uL (ref 3.9–10.0)

## 2016-04-16 NOTE — Progress Notes (Signed)
Hematology and Oncology Follow Up Visit  Vickie Velazquez [id number] 10-23-1965 50 y.o. 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 She received 2 doses of Feraheme in May and had a nice response. She is now symptomatic with fatigue, palpitations, shakiness, dizziness, leg cramps and dry mouth and eyes. She states that these are all symptoms she experiences when she is iron deficient.  No fever, chills, n/v, cough, rash, chest pain, abdominal pain or changes in bowel or bladder habits.  No swelling, tenderness, numbness or tingling in her extremities. No new aches or pains.  She has maintained a good appetite and is staying well hydrated. Her weight is stable.  She has taken a new job in [place] and will be moving there at the end of the month. She will still be coming to our office for follow-up and treatment as needed.   Medications:    Medication List       Accurate as of 04/16/16  3:09 PM. Always use your most recent med list.          CHEWABLE IRON PO Take 54 mg by mouth 3 (three) times daily.   conjugated estrogens vaginal cream Commonly known as:  PREMARIN 1/2 gram vaginally twice weekly   fluticasone 50 MCG/ACT nasal spray Commonly known as:  FLONASE Place into both nostrils daily.   SUMAtriptan 20 MG/ACT nasal spray Commonly known as:  IMITREX Place 1 spray into the nose.   temazepam 15 MG capsule Commonly known as:  RESTORIL 1-2 tabs nightly as needed for insomnia       Allergies:  Allergies  Allergen Reactions  . Sulfa Antibiotics Rash    Past Medical History, Surgical history, Social history, and Family History were reviewed and updated.  Review of Systems: All other 10 point review of systems is negative.   Physical Exam:  height is  (1.626 m) and weight is 141 lb (64 kg). Her oral temperature is  98 F (36.7 C). Her blood pressure is 118/65 and her pulse is 63. Her respiration is 18.   Wt Readings from Last 3 Encounters:  04/16/16 141 lb (64 kg)  02/01/16 139 lb 6.4 oz (63.2 kg)  01/09/16 140 lb (63.5 kg)    Ocular: Sclerae unicteric, pupils equal, round and reactive to light Ear-nose-throat: Oropharynx clear, dentition fair Lymphatic: No cervical supraclavicular or axillary adenopathy Lungs no rales or rhonchi, good excursion bilaterally Heart regular rate and rhythm, no murmur appreciated Abd soft, nontender, positive bowel sounds, no liver or spleen tip palpated on exam MSK no focal spinal tenderness, no joint edema Neuro: non-focal, well-oriented, appropriate affect Breasts: Deferred  Lab Results  Component Value Date   WBC 6.1 04/16/2016   HGB 12.0 04/16/2016   HCT 36.9 04/16/2016   MCV 96 04/16/2016   PLT 238 04/16/2016   Lab Results  Component Value Date   FERRITIN 246 (H) 02/01/2016   IRON 139 02/01/2016   TIBC 376 01/09/2016   UIBC 295 01/09/2016   IRONPCTSAT 22 01/09/2016   Lab Results  Component Value Date   RBC 3.83 04/16/2016   No results found for: KPAFRELGTCHN, LAMBDASER, KAPLAMBRATIO No results found for: IGGSERUM, IGA, IGMSERUM No results found for: TOTALPROTELP, ALBUMINELP, A1GS, A2GS, BETS, BETA2SER, GAMS, MSPIKE, SPEI   Chemistry      Component Value Date/Time  NA 140 01/09/2016 1417   K 4.3 01/09/2016 1417   CL 107 (H) 01/09/2016 1417   CO2 26 01/09/2016 1417   BUN 17 01/09/2016 1417   CREATININE 0.69 01/09/2016 1417   CREATININE 0.75 12/28/2015 1459      Component Value Date/Time   CALCIUM 9.2 01/09/2016 1417   ALKPHOS 75 01/09/2016 1417   AST 18 01/09/2016 1417   ALT 13 01/09/2016 1417   BILITOT 0.2 01/09/2016 1417     Impression and Plan: Vickie Velazquez is a very pleasant 50 yo white female with iron 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 She is now symptomatic with fatigue, palpitations, shakiness, dizziness, leg  cramps and dry mouth and eyes.  We will plan to give her iron tomorrow and then see what her iron studies show and if she needs an addition infusion.  We will plan to see her back in 6 weeks for repeat labs and follow-up.  She will contact our office with any questions or concerns. We can certainly see her sooner if need be.   Verdie MosherINCINNATI,Adalbert Alberto M, NP 8/9/20173:09 PM

## 2016-04-17 LAB — FERRITIN: Ferritin: 80 ng/ml (ref 9–269)

## 2016-04-17 LAB — IRON AND TIBC
%SAT: 28 % (ref 21–57)
Iron: 84 ug/dL (ref 41–142)
TIBC: 305 ug/dL (ref 236–444)
UIBC: 221 ug/dL (ref 120–384)

## 2016-04-22 ENCOUNTER — Ambulatory Visit (HOSPITAL_BASED_OUTPATIENT_CLINIC_OR_DEPARTMENT_OTHER): Payer: BLUE CROSS/BLUE SHIELD

## 2016-04-22 VITALS — BP 122/68 | HR 59 | Temp 98.0°F | Resp 18

## 2016-04-22 DIAGNOSIS — D509 Iron deficiency anemia, unspecified: Secondary | ICD-10-CM

## 2016-04-22 DIAGNOSIS — D508 Other iron deficiency anemias: Secondary | ICD-10-CM | POA: Diagnosis not present

## 2016-04-22 DIAGNOSIS — K909 Intestinal malabsorption, unspecified: Secondary | ICD-10-CM

## 2016-04-22 MED ORDER — FERUMOXYTOL INJECTION 510 MG/17 ML
510.0000 mg | Freq: Once | INTRAVENOUS | Status: AC
  Administered 2016-04-22: 510 mg via INTRAVENOUS
  Filled 2016-04-22: qty 17

## 2016-04-22 NOTE — Patient Instructions (Signed)

## 2016-05-14 ENCOUNTER — Other Ambulatory Visit: Payer: Self-pay | Admitting: Obstetrics & Gynecology

## 2016-05-15 NOTE — Telephone Encounter (Signed)
Medication refill request: Temazepam Last AEX:  12/28/15 SM Next AEX: 04/10/17 SM Last MMG (if hormonal medication request): 01/18/16 BIRADS1 Refill authorized: 04/08/16 #60 0R. Please advise. Thank you.

## 2016-05-26 ENCOUNTER — Ambulatory Visit: Payer: BLUE CROSS/BLUE SHIELD | Admitting: Family

## 2016-05-26 ENCOUNTER — Other Ambulatory Visit: Payer: BLUE CROSS/BLUE SHIELD

## 2016-06-02 ENCOUNTER — Telehealth: Payer: Self-pay | Admitting: *Deleted

## 2016-06-02 NOTE — Telephone Encounter (Signed)
Called patient in response to a call she made to the on-call service early this morning. She c/o abdominal pain - constant. She went to the ED as instructed by the call service and they diagnosed her with ulcers. She already has an appointment this week with Dr Myna HidalgoEnnever and doesn't feel that she needs to be seen any sooner.  Reviewed with Dr Myna HidalgoEnnever. No additional orders.

## 2016-06-06 ENCOUNTER — Other Ambulatory Visit: Payer: BLUE CROSS/BLUE SHIELD

## 2016-06-06 ENCOUNTER — Ambulatory Visit: Payer: BLUE CROSS/BLUE SHIELD | Admitting: Family

## 2016-06-10 ENCOUNTER — Encounter: Payer: Self-pay | Admitting: Family

## 2016-06-10 ENCOUNTER — Other Ambulatory Visit (HOSPITAL_BASED_OUTPATIENT_CLINIC_OR_DEPARTMENT_OTHER): Payer: BLUE CROSS/BLUE SHIELD

## 2016-06-10 ENCOUNTER — Ambulatory Visit (HOSPITAL_BASED_OUTPATIENT_CLINIC_OR_DEPARTMENT_OTHER): Payer: BLUE CROSS/BLUE SHIELD | Admitting: Family

## 2016-06-10 VITALS — BP 116/73 | HR 66 | Temp 98.1°F | Resp 18 | Ht 64.0 in | Wt 134.8 lb

## 2016-06-10 DIAGNOSIS — K909 Intestinal malabsorption, unspecified: Secondary | ICD-10-CM | POA: Diagnosis not present

## 2016-06-10 DIAGNOSIS — K259 Gastric ulcer, unspecified as acute or chronic, without hemorrhage or perforation: Secondary | ICD-10-CM | POA: Diagnosis not present

## 2016-06-10 DIAGNOSIS — Z9884 Bariatric surgery status: Secondary | ICD-10-CM

## 2016-06-10 DIAGNOSIS — D508 Other iron deficiency anemias: Secondary | ICD-10-CM | POA: Diagnosis not present

## 2016-06-10 DIAGNOSIS — D509 Iron deficiency anemia, unspecified: Secondary | ICD-10-CM

## 2016-06-10 LAB — CBC WITH DIFFERENTIAL (CANCER CENTER ONLY)
BASO#: 0 10*3/uL (ref 0.0–0.2)
BASO%: 0.6 % (ref 0.0–2.0)
EOS%: 2.6 % (ref 0.0–7.0)
Eosinophils Absolute: 0.1 10*3/uL (ref 0.0–0.5)
HCT: 39.3 % (ref 34.8–46.6)
HGB: 13.3 g/dL (ref 11.6–15.9)
LYMPH#: 1.9 10*3/uL (ref 0.9–3.3)
LYMPH%: 35.3 % (ref 14.0–48.0)
MCH: 31.4 pg (ref 26.0–34.0)
MCHC: 33.8 g/dL (ref 32.0–36.0)
MCV: 93 fL (ref 81–101)
MONO#: 0.6 10*3/uL (ref 0.1–0.9)
MONO%: 10.4 % (ref 0.0–13.0)
NEUT#: 2.7 10*3/uL (ref 1.5–6.5)
NEUT%: 51.1 % (ref 39.6–80.0)
Platelets: 261 10*3/uL (ref 145–400)
RBC: 4.24 10*6/uL (ref 3.70–5.32)
RDW: 11.9 % (ref 11.1–15.7)
WBC: 5.4 10*3/uL (ref 3.9–10.0)

## 2016-06-10 LAB — IRON AND TIBC
%SAT: 39 % (ref 21–57)
Iron: 107 ug/dL (ref 41–142)
TIBC: 271 ug/dL (ref 236–444)
UIBC: 164 ug/dL (ref 120–384)

## 2016-06-10 LAB — FERRITIN: Ferritin: 167 ng/ml (ref 9–269)

## 2016-06-10 NOTE — Progress Notes (Signed)
Hematology and Oncology Follow Up Visit  Vickie Velazquez [id number] October 30, 1965 50 y.o. 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 Vickie Velazquez is here today for a follow-up. She responded nicely to the [facility] she received in August and her symptoms of anemia have resolved. Hgb is now 13.3 with an MCV of 93.  She now has an issue with abdominal pain due to a gastric ulcer. She has a new patient appointment with GI in [place] later this month. She is taking Carafate daily which has helped with the pain.  She has had some constipation and takes Milk of Magnesia during the day PRN and Senakot at night.   No fever, chills, n/v, cough, rash, dizziness, SOB, chest pain, palpitations or changes in bladder habits.  No swelling, tenderness, numbness or tingling in her extremities. No new aches or pains.  She has maintained a good appetite and is staying well hydrated. Her weight is stable.   Medications:    Medication List       Accurate as of 06/10/16 11:48 AM. Always use your most recent med list.          CHEWABLE IRON PO Take 54 mg by mouth 3 (three) times daily.   conjugated estrogens vaginal cream Commonly known as:  PREMARIN 1/2 gram vaginally twice weekly   fluticasone 50 MCG/ACT nasal spray Commonly known as:  FLONASE Place into both nostrils daily.   HYDROcodone-acetaminophen 5-325 MG tablet Commonly known as:  NORCO/VICODIN   sucralfate 1 g tablet Commonly known as:  CARAFATE   SUMAtriptan 20 MG/ACT nasal spray Commonly known as:  IMITREX Place 1 spray into the nose.   temazepam 15 MG capsule Commonly known as:  RESTORIL TAKE 2 CAPSULES BY MOUTH EVERY DAY AT BEDTIME AS NEEDED       Allergies:  Allergies  Allergen Reactions  . Sulfa Antibiotics Rash    Past Medical History, Surgical history, Social history, and Family History were reviewed and  updated.  Review of Systems: All other 10 point review of systems is negative.   Physical Exam:  height is 5\' 4"  (1.626 m) and weight is 134 lb 12.8 oz (61.1 kg). Her oral temperature is 98.1 F (36.7 C). Her blood pressure is 116/73 and her pulse is 66. Her respiration is 18.   Wt Readings from Last 3 Encounters:  06/10/16 134 lb 12.8 oz (61.1 kg)  04/16/16 141 lb (64 kg)  02/01/16 139 lb 6.4 oz (63.2 kg)    Ocular: Sclerae unicteric, pupils equal, round and reactive to light Ear-nose-throat: Oropharynx clear, dentition fair Lymphatic: No cervical supraclavicular or axillary adenopathy Lungs no rales or rhonchi, good excursion bilaterally Heart regular rate and rhythm, no murmur appreciated Abd soft, nontender, positive bowel sounds, no liver or spleen tip palpated on exam MSK no focal spinal tenderness, no joint edema Neuro: non-focal, well-oriented, appropriate affect Breasts: Deferred  Lab Results  Component Value Date   WBC 5.4 06/10/2016   HGB 13.3 06/10/2016   HCT 39.3 06/10/2016   MCV 93 06/10/2016   PLT 261 06/10/2016   Lab Results  Component Value Date   FERRITIN 80 04/16/2016   IRON 84 04/16/2016   TIBC 305 04/16/2016   UIBC 221 04/16/2016   IRONPCTSAT 28 04/16/2016   Lab Results  Component Value Date   RBC 4.24 06/10/2016   No results found for: KPAFRELGTCHN, LAMBDASER,  KAPLAMBRATIO No results found for: IGGSERUM, IGA, IGMSERUM No results found for: Marda StalkerOTALPROTELP, ALBUMINELP, A1GS, A2GS, BETS, BETA2SER, GAMS, MSPIKE, SPEI   Chemistry      Component Value Date/Time   NA 140 01/09/2016 1417   K 4.3 01/09/2016 1417   CL 107 (H) 01/09/2016 1417   CO2 26 01/09/2016 1417   BUN 17 01/09/2016 1417   CREATININE 0.69 01/09/2016 1417   CREATININE 0.75 12/28/2015 1459      Component Value Date/Time   CALCIUM 9.2 01/09/2016 1417   ALKPHOS 75 01/09/2016 1417   AST 18 01/09/2016 1417   ALT 13 01/09/2016 1417   BILITOT 0.2 01/09/2016 1417     Impression  and Plan: Ms. Vickie Velazquez is a very pleasant 50 yo white female with iron 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 She is doing well since received Feraheme in August and is asymptomatic at this time.  We will see what her iron studies show and bring her back in later this week for an infusion if needed.  She sees GI later this month for her gastric ulcer.  We will plan to see her back in 3 months for repeat labs and follow-up.  She will contact our office with any questions or concerns. We can certainly see her sooner if need be.   Verdie MosherINCINNATI,SARAH M, NP 10/3/201711:48 AM

## 2016-06-11 ENCOUNTER — Encounter: Payer: Self-pay | Admitting: *Deleted

## 2016-08-02 ENCOUNTER — Other Ambulatory Visit: Payer: Self-pay | Admitting: Obstetrics & Gynecology

## 2016-08-04 NOTE — Telephone Encounter (Signed)
Medication refill request: Restoril  Last AEX:  12-28-15  Next AEX: 04-10-17  Last MMG (if hormonal medication request): 01-18-16 WNL  Refill authorized: pleasre advise

## 2016-09-15 ENCOUNTER — Other Ambulatory Visit: Payer: BLUE CROSS/BLUE SHIELD

## 2016-09-15 ENCOUNTER — Ambulatory Visit: Payer: BLUE CROSS/BLUE SHIELD | Admitting: Family

## 2016-09-18 ENCOUNTER — Encounter (HOSPITAL_COMMUNITY): Payer: Self-pay

## 2016-12-30 ENCOUNTER — Ambulatory Visit: Payer: BLUE CROSS/BLUE SHIELD | Admitting: Obstetrics & Gynecology

## 2017-01-06 ENCOUNTER — Ambulatory Visit (INDEPENDENT_AMBULATORY_CARE_PROVIDER_SITE_OTHER): Payer: BLUE CROSS/BLUE SHIELD | Admitting: Obstetrics & Gynecology

## 2017-01-06 ENCOUNTER — Encounter: Payer: Self-pay | Admitting: Obstetrics & Gynecology

## 2017-01-06 ENCOUNTER — Other Ambulatory Visit (HOSPITAL_COMMUNITY)
Admission: RE | Admit: 2017-01-06 | Discharge: 2017-01-06 | Disposition: A | Discharge status: Home / Self Care | Payer: BLUE CROSS/BLUE SHIELD | Source: Ambulatory Visit | Attending: Obstetrics & Gynecology | Admitting: Obstetrics & Gynecology

## 2017-01-06 VITALS — BP 102/60 | HR 64 | Resp 18 | Ht 63.75 in | Wt 135.0 lb

## 2017-01-06 DIAGNOSIS — N3090 Cystitis, unspecified without hematuria: Secondary | ICD-10-CM | POA: Diagnosis not present

## 2017-01-06 DIAGNOSIS — K219 Gastro-esophageal reflux disease without esophagitis: Secondary | ICD-10-CM | POA: Insufficient documentation

## 2017-01-06 DIAGNOSIS — Z0000 Encounter for general adult medical examination without abnormal findings: Secondary | ICD-10-CM

## 2017-01-06 DIAGNOSIS — Z9884 Bariatric surgery status: Secondary | ICD-10-CM | POA: Diagnosis not present

## 2017-01-06 DIAGNOSIS — E559 Vitamin D deficiency, unspecified: Secondary | ICD-10-CM

## 2017-01-06 DIAGNOSIS — Z01419 Encounter for gynecological examination (general) (routine) without abnormal findings: Secondary | ICD-10-CM | POA: Diagnosis not present

## 2017-01-06 DIAGNOSIS — Z124 Encounter for screening for malignant neoplasm of cervix: Secondary | ICD-10-CM | POA: Diagnosis not present

## 2017-01-06 DIAGNOSIS — R350 Frequency of micturition: Secondary | ICD-10-CM

## 2017-01-06 DIAGNOSIS — R790 Abnormal level of blood mineral: Secondary | ICD-10-CM

## 2017-01-06 LAB — POCT URINALYSIS DIPSTICK
Bilirubin, UA: NEGATIVE
Blood, UA: NEGATIVE
Glucose, UA: NEGATIVE
Ketones, UA: NEGATIVE
Protein, UA: NEGATIVE
Urobilinogen, UA: NEGATIVE E.U./dL — AB
pH, UA: 7 (ref 5.0–8.0)

## 2017-01-06 LAB — CBC
HCT: 41.3 % (ref 35.0–45.0)
Hemoglobin: 13.4 g/dL (ref 11.7–15.5)
MCH: 30 pg (ref 27.0–33.0)
MCHC: 32.4 g/dL (ref 32.0–36.0)
MCV: 92.4 fL (ref 80.0–100.0)
MPV: 9.1 fL (ref 7.5–12.5)
Platelets: 261 10*3/uL (ref 140–400)
RBC: 4.47 MIL/uL (ref 3.80–5.10)
RDW: 13.2 % (ref 11.0–15.0)
WBC: 7.8 10*3/uL (ref 3.8–10.8)

## 2017-01-06 LAB — IRON: Iron: 91 ug/dL (ref 45–160)

## 2017-01-06 MED ORDER — AMOXICILLIN-POT CLAVULANATE 875-125 MG PO TABS: 1.0000 | ORAL_TABLET | Freq: Two times a day (BID) | ORAL | 0 refills | Status: DC

## 2017-01-06 MED ORDER — AMOXICILLIN-POT CLAVULANATE 875-125 MG PO TABS
1.0000 | ORAL_TABLET | Freq: Two times a day (BID) | ORAL | 0 refills | Status: DC
Start: 2017-01-06 — End: 2017-01-06

## 2017-01-06 NOTE — Progress Notes (Signed)
51 y.o. Vickie Velazquez MarriedCaucasianF here for annual exam.  Doing well except for recent UTI.  Living near [place].  Has been treated twice in the last month for a UTI.  Has been treated with Cipro and another antibiotics.    Patient's last menstrual period was 09/09/2007.          Sexually active: Yes.    The current method of family planning is post menopausal status.    Exercising: Yes.    running/walking Smoker:  no  Health Maintenance: Pap:  10/20/14 Neg. 06/14/12 Neg. HR HPV:Neg History of abnormal Pap:  no MMG: 01/18/16 BIRADS1, [facility] Colonoscopy:  01/20/14 Repeat 5 years  BMD:   Never TDaP:  2012 Screening Labs: PCP  Hep C:  Donated lost year   reports that she has never smoked. She has never used smokeless tobacco. She reports that she drinks about 1.2 - 1.8 oz of alcohol per week . She reports that she does not use drugs.  Past Medical History:  Diagnosis Date  . GERD (gastroesophageal reflux disease)   . Hiatal hernia    Patient reported on 04/24/12  . Iron deficiency anemia 01/09/2016  . 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 Migraine    with menses  . Morbid obesity (HCC)   . 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 Plantar fasciitis    Patient-reported on 04/24/12  . Seasonal allergies     Past Surgical History:  Procedure Laterality Date  . GASTRIC ROUX-EN-Y  08/02/2012   Procedure: LAPAROSCOPIC ROUX-EN-Y GASTRIC BYPASS WITH UPPER ENDOSCOPY;  Surgeon: [name], DO;  Location: [facility];  Service: General;  Laterality: N/A;  . HERNIA REPAIR  08/02/12  . LATERAL COLLATERAL LIGAMENT REPAIR, ELBOW  2008   lateral release-rt elbow  . TRIGGER FINGER RELEASE  10/16/2011   Procedure: RELEASE TRIGGER FINGER/A-1 PULLEY;  Surgeon: Tami Ribas, MD;  Location: [facility];  Service: Orthopedics;  Laterality: Right;  right small finger    Current Outpatient Prescriptions  Medication Sig Dispense Refill  . conjugated estrogens (PREMARIN) vaginal  cream 1/2 gram vaginally twice weekly 30 g 2  . fluticasone (FLONASE) 50 MCG/ACT nasal spray Place into both nostrils daily.    . Naproxen Sodium (ALEVE PO) Take by mouth as needed.    Marland Kitchen omeprazole (PRILOSEC) 40 MG capsule Take 1 capsule by mouth daily.  0  . ranitidine (ZANTAC) 150 MG capsule Take 150 mg by mouth every evening.    . sucralfate (CARAFATE) 1 g tablet   0  . SUMAtriptan (IMITREX) 20 MG/ACT nasal spray Place 1 spray into the nose.  5  . temazepam (RESTORIL) 15 MG capsule TAKE 2 CAPSULES BY MOUTH EVERY DAY AT BEDTIME AS NEEDED 60 capsule 0   No current facility-administered medications for this visit.     Family History  Problem Relation Age of Onset  . Anesthesia problems Mother     hard to wake up  . Cancer Mother     breast/thyroid  . Lupus Mother   . Diabetes Mother   . Graves' disease Mother   . Cancer Father     melanoma-3 years ago, new diag of skin cancer 1/15  . Heart disease Father   . Cancer Brother     colon  . Cancer Maternal Aunt     breast  . Cancer Maternal Grandmother     breast  . Diabetes Daughter   . Melanoma Maternal Uncle     ROS:  Pertinent items are noted in HPI.  Otherwise, a comprehensive ROS was negative.  Exam:   BP 102/60 (BP Location: Right Arm, Patient Position: Sitting, Cuff Size: Normal)   Pulse 64   Resp 18   Ht 5' 3.75" (1.619 m)   Wt 135 lb (61.2 kg)   LMP 09/09/2007   BMI 23.35 kg/m   Weight change: stable   Height: 5' 3.75" (161.9 cm)  Ht Readings from Last 3 Encounters:  01/06/17 5' 3.75" (1.619 m)  06/10/16  (1.626 m)  04/16/16  (1.626 m)    General appearance: alert, cooperative and appears stated age Head: Normocephalic, without obvious abnormality, atraumatic Neck: no adenopathy, supple, symmetrical, trachea midline and thyroid normal to inspection and palpation Lungs: clear to auscultation bilaterally Breasts: normal appearance, no masses or tenderness Heart: regular rate and rhythm Abdomen:  soft, non-tender; bowel sounds normal; no masses,  no organomegaly Extremities: extremities normal, atraumatic, no cyanosis or edema Skin: Skin color, texture, turgor normal. No rashes or lesions Lymph nodes: Cervical, supraclavicular, and axillary nodes normal. No abnormal inguinal nodes palpated Neurologic: Grossly normal   Pelvic: External genitalia:  no lesions              Urethra:  normal appearing urethra with no masses, tenderness or lesions              Bartholins and Skenes: normal                 Vagina: normal appearing vagina with normal color and discharge, no lesions              Cervix: no lesions              Pap taken: Yes.   Bimanual Exam:  Uterus:  normal size, contour, position, consistency, mobility, non-tender              Adnexa: normal adnexa and no mass, fullness, tenderness               Rectovaginal: Confirms               Anus:  normal sphincter tone, no lesions  Chaperone was present for exam.  A:  Well Woman with normal exam PMP, no HRT H/O bariatric surgery 11/13, lost 150 pounds H/O b12 deficiency and low iron Insomnia UTI  P:   Mammogram guidelines reviewed pap smear and HR HPV obtained today B 12, CBC, ferritin, Vit D, and iron obtained today RX for Augmentin 875 BID x 7 days Urine culture pending Seeing new PCP in the [place] area return annually or prn

## 2017-01-07 LAB — FERRITIN: Ferritin: 89 ng/mL (ref 10–232)

## 2017-01-07 LAB — VITAMIN D 25 HYDROXY (VIT D DEFICIENCY, FRACTURES): Vit D, 25-Hydroxy: 22 ng/mL — ABNORMAL LOW (ref 30–100)

## 2017-01-08 LAB — CYTOLOGY - PAP
Diagnosis: NEGATIVE
HPV: NOT DETECTED

## 2017-01-08 LAB — URINE CULTURE

## 2017-01-08 MED ORDER — FLUCONAZOLE 150 MG PO TABS: 150.0000 mg | ORAL_TABLET | Freq: Once | ORAL | 0 refills | Status: AC

## 2017-01-08 MED ORDER — CEPHALEXIN 500 MG PO CAPS: 500.0000 mg | ORAL_CAPSULE | Freq: Two times a day (BID) | ORAL | 0 refills | Status: DC

## 2017-01-08 NOTE — Addendum Note (Signed)
Addended by: Jerene BearsMILLER, Ayra S on: 01/08/2017 06:11 PM   Modules accepted: Orders

## 2017-01-17 ENCOUNTER — Encounter: Payer: Self-pay | Admitting: Obstetrics & Gynecology

## 2017-01-22 NOTE — Telephone Encounter (Signed)
Patient called in and scheduled a urine culture for tomorrow, 01/22/17, at 8:30 AM. I put her on the nurse schedule. She said she initially was not going to be in town but, due to changes, will be here in town tomorrow only.

## 2017-01-23 ENCOUNTER — Ambulatory Visit (INDEPENDENT_AMBULATORY_CARE_PROVIDER_SITE_OTHER): Payer: BLUE CROSS/BLUE SHIELD

## 2017-01-23 VITALS — BP 106/60 | HR 64 | Temp 98.0°F | Resp 16 | Wt 135.0 lb

## 2017-01-23 DIAGNOSIS — N3090 Cystitis, unspecified without hematuria: Secondary | ICD-10-CM | POA: Diagnosis not present

## 2017-01-23 NOTE — Progress Notes (Signed)
Patient in office for a urine culture. Per patient, she is feeling much better and has completed all antibiotics. Urine sample given and culture has been sent to the lab.   Routing to provider for final review. Patient agreeable to disposition. Will close encounter.

## 2017-01-24 LAB — URINE CULTURE: Organism ID, Bacteria: NO GROWTH

## 2017-02-27 ENCOUNTER — Telehealth: Payer: Self-pay | Admitting: Hematology & Oncology

## 2017-02-27 ENCOUNTER — Other Ambulatory Visit: Payer: Self-pay | Admitting: Family

## 2017-02-27 NOTE — Telephone Encounter (Signed)
Patient called and resch 09/15/16 missed apt for 03/23/17

## 2017-03-02 ENCOUNTER — Encounter (HOSPITAL_COMMUNITY): Payer: Self-pay

## 2017-03-23 ENCOUNTER — Ambulatory Visit (HOSPITAL_BASED_OUTPATIENT_CLINIC_OR_DEPARTMENT_OTHER): Payer: BLUE CROSS/BLUE SHIELD | Admitting: Family

## 2017-03-23 ENCOUNTER — Other Ambulatory Visit (HOSPITAL_BASED_OUTPATIENT_CLINIC_OR_DEPARTMENT_OTHER): Payer: BLUE CROSS/BLUE SHIELD

## 2017-03-23 VITALS — BP 136/79 | HR 60 | Temp 98.0°F | Resp 16 | Wt 132.0 lb

## 2017-03-23 DIAGNOSIS — K909 Intestinal malabsorption, unspecified: Secondary | ICD-10-CM

## 2017-03-23 DIAGNOSIS — D508 Other iron deficiency anemias: Secondary | ICD-10-CM | POA: Diagnosis not present

## 2017-03-23 DIAGNOSIS — Z9884 Bariatric surgery status: Secondary | ICD-10-CM

## 2017-03-23 LAB — IRON AND TIBC
%SAT: 38 % (ref 21–57)
Iron: 120 ug/dL (ref 41–142)
TIBC: 318 ug/dL (ref 236–444)
UIBC: 197 ug/dL (ref 120–384)

## 2017-03-23 LAB — CBC WITH DIFFERENTIAL (CANCER CENTER ONLY)
BASO#: 0 10*3/uL (ref 0.0–0.2)
BASO%: 0.4 % (ref 0.0–2.0)
EOS%: 1.7 % (ref 0.0–7.0)
Eosinophils Absolute: 0.1 10*3/uL (ref 0.0–0.5)
HCT: 40.1 % (ref 34.8–46.6)
HGB: 13 g/dL (ref 11.6–15.9)
LYMPH#: 2.3 10*3/uL (ref 0.9–3.3)
LYMPH%: 44.2 % (ref 14.0–48.0)
MCH: 30.8 pg (ref 26.0–34.0)
MCHC: 32.4 g/dL (ref 32.0–36.0)
MCV: 95 fL (ref 81–101)
MONO#: 0.6 10*3/uL (ref 0.1–0.9)
MONO%: 10.8 % (ref 0.0–13.0)
NEUT#: 2.2 10*3/uL (ref 1.5–6.5)
NEUT%: 42.9 % (ref 39.6–80.0)
Platelets: 229 10*3/uL (ref 145–400)
RBC: 4.22 10*6/uL (ref 3.70–5.32)
RDW: 13.1 % (ref 11.1–15.7)
WBC: 5.2 10*3/uL (ref 3.9–10.0)

## 2017-03-23 LAB — FERRITIN: Ferritin: 99 ng/ml (ref 9–269)

## 2017-03-23 NOTE — Progress Notes (Signed)
Hematology and Oncology Follow Up Visit  Vickie Velazquez [id number] 01/30/1966 51 y.o. 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 She is symptomatic at this time with fatigue, palpitations, headache, ringing in ears at times and thirsty for cold water.  No fever, chills, n/v, cough, rash, dizziness, SOB, chest pain, palpitations, abdominal pain or changes in bowel or bladder habits.  She states that she had a recent endoscopy which showed a small ulcer. She is taking her Prilosec daily as prescribed.  She has had an issue with frequent UTI's and is followed closely by her PCP.  No swelling, tenderness, numbness or tingling in her extremities. No c/o pain at this time.  She has maintained a good appetite and is staying well hydrated. Her weight is stable.   ECOG Performance Status: 1 - Symptomatic but completely ambulatory  Medications:  Allergies as of 03/23/2017      Reactions   Sulfa Antibiotics Rash      Medication List       Accurate as of 03/23/17  9:44 AM. Always use your most recent med list.          fluticasone 50 MCG/ACT nasal spray Commonly known as:  FLONASE Place into both nostrils daily.   omeprazole 40 MG capsule Commonly known as:  PRILOSEC Take 1 capsule by mouth daily.   SUMAtriptan 20 MG/ACT nasal spray Commonly known as:  IMITREX Place 1 spray into the nose.   temazepam 30 MG capsule Commonly known as:  RESTORIL Take 30 mg by mouth.       Allergies:  Allergies  Allergen Reactions  . Sulfa Antibiotics Rash    Past Medical History, Surgical history, Social history, and Family History were reviewed and updated.  Review of Systems: All other 10 point review of systems is negative.   Physical Exam:  weight is 132 lb (59.9 kg). Her oral temperature is 98 F (36.7 C).  Her blood pressure is 136/79 and her pulse is 60. Her respiration is 16 and oxygen saturation is 100%.   Wt Readings from Last 3 Encounters:  03/23/17 132 lb (59.9 kg)  01/23/17 135 lb (61.2 kg)  01/06/17 135 lb (61.2 kg)    Ocular: Sclerae unicteric, pupils equal, round and reactive to light Ear-nose-throat: Oropharynx clear, dentition fair Lymphatic: No cervical, supraclavicular or axillary adenopathy Lungs no rales or rhonchi, good excursion bilaterally Heart regular rate and rhythm, no murmur appreciated Abd soft, nontender, positive bowel sounds, no liver or spleen tip palpated on exam, no fluid wave MSK no focal spinal tenderness, no joint edema Neuro: non-focal, well-oriented, appropriate affect Breasts: Deferred   Lab Results  Component Value Date   WBC 5.2 03/23/2017   HGB 13.0 03/23/2017   HCT 40.1 03/23/2017   MCV 95 03/23/2017   PLT 229 03/23/2017   Lab Results  Component Value Date   FERRITIN 89 01/06/2017   IRON 91 01/06/2017   TIBC 271 06/10/2016   UIBC 164 06/10/2016   IRONPCTSAT 39 06/10/2016   Lab Results  Component Value Date   RBC 4.22 03/23/2017   No results found for: KPAFRELGTCHN, LAMBDASER, KAPLAMBRATIO No results found for: IGGSERUM, IGA, IGMSERUM No results found for: TOTALPROTELP, ALBUMINELP, A1GS, A2GS, BETS, BETA2SER, GAMS, MSPIKE, SPEI   Chemistry      Component Value Date/Time  NA 140 01/09/2016 1417   K 4.3 01/09/2016 1417   CL 107 (H) 01/09/2016 1417   CO2 26 01/09/2016 1417   BUN 17 01/09/2016 1417   CREATININE 0.69 01/09/2016 1417   CREATININE 0.75 12/28/2015 1459      Component Value Date/Time   CALCIUM 9.2 01/09/2016 1417   ALKPHOS 75 01/09/2016 1417   AST 18 01/09/2016 1417   ALT 13 01/09/2016 1417   BILITOT 0.2 01/09/2016 1417      Impression and Plan: Vickie Velazquez is a very pleasant 51 yo caucasian female with 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 She has not received IV iron since August  of last year. She is symptomatic at this time with fatigue, headache, palpitations, craving cold water and ringing in ears.  We will see what her iron studies show and bring her back in tomorrow for IV iron if needed before she goes back to [employer] for work.  We will go ahead and plan to see her back in 6 months for repeat lab work and follow-up.  She will contact our office with any questions or concerns. We can certainly see her sooner if need be.   Verdie Mosher, NP 7/16/20189:44 AM

## 2017-03-27 ENCOUNTER — Telehealth: Payer: Self-pay | Admitting: Obstetrics & Gynecology

## 2017-03-27 NOTE — Telephone Encounter (Signed)
Patient would like nurse to call and give her the names of the antibiotics that were prescribed by Dr Hyacinth MeekerMiller.

## 2017-03-27 NOTE — Telephone Encounter (Signed)
Spoke with patient. Patient states she is following up with PCP for UTI,  requesting names of antibiotics taken for  UTI in May 2018.   Augmentin 875-125 mg bid x7 days on 01/06/17 Keflex 500mg  bid  x7 days on 01/08/17  Patient states she also took 2  Pills for yeast, unsure of name? Advised Diflucan 150 mg prescribed 01/08/17.   Routing to covering provider for final review. Patient is agreeable to disposition. Will close encounter.   Cc: Dr. Hyacinth MeekerMiller

## 2017-03-27 NOTE — Telephone Encounter (Signed)
Left message to call Hayla Hinger at [phone].  

## 2017-04-10 ENCOUNTER — Ambulatory Visit: Payer: BLUE CROSS/BLUE SHIELD | Admitting: Obstetrics & Gynecology

## 2017-11-03 ENCOUNTER — Encounter (HOSPITAL_BASED_OUTPATIENT_CLINIC_OR_DEPARTMENT_OTHER): Payer: Self-pay | Admitting: *Deleted

## 2017-11-03 ENCOUNTER — Other Ambulatory Visit: Payer: Self-pay

## 2017-11-03 ENCOUNTER — Other Ambulatory Visit: Payer: Self-pay | Admitting: Orthopedic Surgery

## 2017-11-05 ENCOUNTER — Other Ambulatory Visit: Payer: Self-pay

## 2017-11-05 ENCOUNTER — Ambulatory Visit (HOSPITAL_BASED_OUTPATIENT_CLINIC_OR_DEPARTMENT_OTHER): Payer: BLUE CROSS/BLUE SHIELD | Admitting: Anesthesiology

## 2017-11-05 ENCOUNTER — Encounter (HOSPITAL_BASED_OUTPATIENT_CLINIC_OR_DEPARTMENT_OTHER)
Admission: RE | Disposition: A | Discharge status: Home / Self Care | Payer: Self-pay | Source: Ambulatory Visit | Attending: Orthopedic Surgery

## 2017-11-05 ENCOUNTER — Ambulatory Visit (HOSPITAL_BASED_OUTPATIENT_CLINIC_OR_DEPARTMENT_OTHER)
Admission: RE | Admit: 2017-11-05 | Discharge: 2017-11-05 | Disposition: A | Discharge status: Home / Self Care | Payer: BLUE CROSS/BLUE SHIELD | Source: Ambulatory Visit | Attending: Orthopedic Surgery | Admitting: Orthopedic Surgery

## 2017-11-05 ENCOUNTER — Encounter (HOSPITAL_BASED_OUTPATIENT_CLINIC_OR_DEPARTMENT_OTHER): Payer: Self-pay

## 2017-11-05 DIAGNOSIS — Z791 Long term (current) use of non-steroidal anti-inflammatories (NSAID): Secondary | ICD-10-CM | POA: Insufficient documentation

## 2017-11-05 DIAGNOSIS — Z6825 Body mass index (BMI) 25.0-25.9, adult: Secondary | ICD-10-CM | POA: Insufficient documentation

## 2017-11-05 DIAGNOSIS — Z79899 Other long term (current) drug therapy: Secondary | ICD-10-CM | POA: Diagnosis not present

## 2017-11-05 DIAGNOSIS — G43909 Migraine, unspecified, not intractable, without status migrainosus: Secondary | ICD-10-CM | POA: Insufficient documentation

## 2017-11-05 DIAGNOSIS — M65341 Trigger finger, right ring finger: Secondary | ICD-10-CM | POA: Diagnosis not present

## 2017-11-05 DIAGNOSIS — Z882 Allergy status to sulfonamides status: Secondary | ICD-10-CM | POA: Diagnosis not present

## 2017-11-05 HISTORY — DX: Trigger finger, unspecified finger: M65.30

## 2017-11-05 HISTORY — PX: TRIGGER FINGER RELEASE: SHX641

## 2017-11-05 SURGERY — RELEASE, A1 PULLEY, FOR TRIGGER FINGER
Anesthesia: Regional | Site: Hand | Laterality: Right

## 2017-11-05 MED ORDER — FENTANYL CITRATE (PF) 100 MCG/2ML IJ SOLN
INTRAMUSCULAR | Status: AC
  Filled 2017-11-05: qty 2

## 2017-11-05 MED ORDER — LACTATED RINGERS IV SOLN
INTRAVENOUS | Status: DC
  Administered 2017-11-05: 09:00:00 via INTRAVENOUS

## 2017-11-05 MED ORDER — LIDOCAINE HCL (PF) 0.5 % IJ SOLN
INTRAMUSCULAR | Status: AC
  Filled 2017-11-05: qty 50

## 2017-11-05 MED ORDER — PROMETHAZINE HCL 25 MG/ML IJ SOLN: 6.2500 mg | INTRAMUSCULAR | Status: DC | PRN

## 2017-11-05 MED ORDER — LIDOCAINE 2% (20 MG/ML) 5 ML SYRINGE
INTRAMUSCULAR | Status: AC
  Filled 2017-11-05: qty 10

## 2017-11-05 MED ORDER — PROPOFOL 500 MG/50ML IV EMUL
INTRAVENOUS | Status: DC | PRN
  Administered 2017-11-05: 50 ug/kg/min via INTRAVENOUS

## 2017-11-05 MED ORDER — PROPOFOL 10 MG/ML IV BOLUS
INTRAVENOUS | Status: AC
  Filled 2017-11-05: qty 40

## 2017-11-05 MED ORDER — FENTANYL CITRATE (PF) 100 MCG/2ML IJ SOLN: 25.0000 ug | INTRAMUSCULAR | Status: DC | PRN

## 2017-11-05 MED ORDER — LIDOCAINE HCL (PF) 2 % IJ SOLN
INTRAMUSCULAR | Status: DC | PRN
  Administered 2017-11-05: 35 mL

## 2017-11-05 MED ORDER — FENTANYL CITRATE (PF) 100 MCG/2ML IJ SOLN
50.0000 ug | INTRAMUSCULAR | Status: DC | PRN
  Administered 2017-11-05: 50 ug via INTRAVENOUS

## 2017-11-05 MED ORDER — OXYCODONE HCL 5 MG/5ML PO SOLN: 5.0000 mg | Freq: Once | ORAL | Status: DC | PRN

## 2017-11-05 MED ORDER — OXYCODONE HCL 5 MG PO TABS: 5.0000 mg | ORAL_TABLET | Freq: Once | ORAL | Status: DC | PRN

## 2017-11-05 MED ORDER — CEFAZOLIN SODIUM-DEXTROSE 2-4 GM/100ML-% IV SOLN
INTRAVENOUS | Status: AC
  Filled 2017-11-05: qty 100

## 2017-11-05 MED ORDER — CHLORHEXIDINE GLUCONATE 4 % EX LIQD: 60.0000 mL | Freq: Once | CUTANEOUS | Status: DC

## 2017-11-05 MED ORDER — MIDAZOLAM HCL 2 MG/2ML IJ SOLN
INTRAMUSCULAR | Status: AC
  Filled 2017-11-05: qty 2

## 2017-11-05 MED ORDER — PHENYLEPHRINE HCL 10 MG/ML IJ SOLN
INTRAMUSCULAR | Status: AC
  Filled 2017-11-05: qty 2

## 2017-11-05 MED ORDER — CEFAZOLIN SODIUM-DEXTROSE 2-4 GM/100ML-% IV SOLN
2.0000 g | INTRAVENOUS | Status: AC
  Administered 2017-11-05: 2 g via INTRAVENOUS

## 2017-11-05 MED ORDER — MIDAZOLAM HCL 2 MG/2ML IJ SOLN
1.0000 mg | INTRAMUSCULAR | Status: DC | PRN
  Administered 2017-11-05: 2 mg via INTRAVENOUS

## 2017-11-05 MED ORDER — HYDROCODONE-ACETAMINOPHEN 5-325 MG PO TABS: ORAL_TABLET | ORAL | 0 refills | Status: DC

## 2017-11-05 MED ORDER — SCOPOLAMINE 1 MG/3DAYS TD PT72: 1.0000 | MEDICATED_PATCH | Freq: Once | TRANSDERMAL | Status: DC | PRN

## 2017-11-05 MED ORDER — BUPIVACAINE HCL (PF) 0.25 % IJ SOLN
INTRAMUSCULAR | Status: DC | PRN
  Administered 2017-11-05: 6 mL

## 2017-11-05 MED ORDER — PHENYLEPHRINE 40 MCG/ML (10ML) SYRINGE FOR IV PUSH (FOR BLOOD PRESSURE SUPPORT)
PREFILLED_SYRINGE | INTRAVENOUS | Status: AC
  Filled 2017-11-05: qty 10

## 2017-11-05 SURGICAL SUPPLY — 28 items
BANDAGE COBAN STERILE 2 (GAUZE/BANDAGES/DRESSINGS) ×2 IMPLANT
BLADE SURG 15 STRL LF DISP TIS (BLADE) ×2 IMPLANT
BLADE SURG 15 STRL SS (BLADE) ×4
BNDG ESMARK 4X9 LF (GAUZE/BANDAGES/DRESSINGS) ×2 IMPLANT
CHLORAPREP W/TINT 26ML (MISCELLANEOUS) ×2 IMPLANT
CORD BIPOLAR FORCEPS 12FT (ELECTRODE) ×2 IMPLANT
COVER BACK TABLE 60X90IN (DRAPES) ×2 IMPLANT
COVER MAYO STAND STRL (DRAPES) ×2 IMPLANT
CUFF TOURNIQUET SINGLE 18IN (TOURNIQUET CUFF) ×2 IMPLANT
DRAPE EXTREMITY T 121X128X90 (DRAPE) ×2 IMPLANT
DRAPE SURG 17X23 STRL (DRAPES) ×2 IMPLANT
GAUZE SPONGE 4X4 12PLY STRL (GAUZE/BANDAGES/DRESSINGS) ×2 IMPLANT
GAUZE XEROFORM 1X8 LF (GAUZE/BANDAGES/DRESSINGS) ×2 IMPLANT
GLOVE BIO SURGEON STRL SZ7.5 (GLOVE) ×2 IMPLANT
GLOVE BIOGEL PI IND STRL 8 (GLOVE) ×1 IMPLANT
GLOVE BIOGEL PI INDICATOR 8 (GLOVE) ×1
GOWN STRL REUS W/ TWL LRG LVL3 (GOWN DISPOSABLE) ×1 IMPLANT
GOWN STRL REUS W/TWL LRG LVL3 (GOWN DISPOSABLE) ×2
GOWN STRL REUS W/TWL XL LVL3 (GOWN DISPOSABLE) ×2 IMPLANT
NEEDLE HYPO 25X1 1.5 SAFETY (NEEDLE) ×2 IMPLANT
NS IRRIG 1000ML POUR BTL (IV SOLUTION) ×2 IMPLANT
PACK BASIN DAY SURGERY FS (CUSTOM PROCEDURE TRAY) ×2 IMPLANT
STOCKINETTE 4X48 STRL (DRAPES) ×2 IMPLANT
SUT ETHILON 4 0 PS 2 18 (SUTURE) ×2 IMPLANT
SYR BULB 3OZ (MISCELLANEOUS) ×2 IMPLANT
SYR CONTROL 10ML LL (SYRINGE) ×2 IMPLANT
TOWEL OR 17X24 6PK STRL BLUE (TOWEL DISPOSABLE) ×4 IMPLANT
UNDERPAD 30X30 (UNDERPADS AND DIAPERS) ×2 IMPLANT

## 2017-11-05 NOTE — Op Note (Signed)
11/05/2017 [facility]  Operative Note  PREOPERATIVE DIAGNOSIS: RIGHT RING TRIGGER FINGER  POSTOPERATIVE DIAGNOSIS:  RIGHT RING TRIGGER FINGER  PROCEDURE: Procedure(s): RELEASE TRIGGER FINGER/A-1 PULLEY RIGHT RING FINGER  SURGEON:  Betha LoaKevin Elliannah Wayment, MD  ASSISTANT:  none.  ANESTHESIA:  Bier block and sedation.  IV FLUIDS:  Per anesthesia flow sheet.  ESTIMATED BLOOD LOSS:  Minimal.  COMPLICATIONS:  None.  SPECIMENS:  None.  TOURNIQUET TIME:  Total Tourniquet Time Documented: Forearm (Right) - 15 minutes Total: Forearm (Right) - 15 minutes   DISPOSITION:  Stable to PACU.  LOCATION: [facility]  INDICATIONS: Vickie CroftMary K Velazquez is a 52 y.o. female with triggering right ring finger.  This has been injected without lasting relief.  She wishes to have a trigger release.  Risks, benefits and alternatives of surgery were discussed including the risk of blood loss, infection, damage to nerves, vessels, tendons, ligaments, bone, failure of surgery, need for additional surgery, complications with wound healing, continued pain, continued triggering and need for repeat surgery.  She voiced understanding of these risks and elected to proceed.  OPERATIVE COURSE:  After being identified preoperatively by myself, the patient and I agreed upon the procedure and site of procedure.  The surgical site was marked. The risks, benefits, and alternatives of surgery were reviewed and she wished to proceed.  Surgical consent had been signed. She was given IV Ancef as preoperative antibiotic prophylaxis. She was transported to the operating room and placed on the operating room table in supine position with the Right upper extremity on an arm board. Bier block and sedation was induced by the anesthesiologist.  The Right upper extremity was prepped and draped in normal sterile orthopedic fashion. A surgical pause was performed between surgeons, anesthesia, and operating room staff, and  all were in agreement as to the patient, procedure, and site of procedure.  Tourniquet at the proximal aspect of the forearm had been inflated for the Bier block.  An incision was made at the volar aspect of the MP joint of the ring finger.  This was carried into the subcutaneous tissues by preading technique.  Bipolar electrocautery was used to obtain hemostasis.  The radial and ulnar digital nerves were protected throughout the case. The flexor sheath was identified.  The A1 pulley was identified and sharply incised.  It was released in its entirety.  The proximal 1-2 mm of the A2 pulley was vented to allow better excursion of the tendons.  The finger was placed through a range of motion and there was noted to be no catching.  The tendons were brought through the wound and any adherences released.  The wound was then copiously irrigated with sterile saline. It was closed with 4-0 nylon in a horizontal mattress fashion.  It was injected with 0.25% plain Marcaine to aid in postoperative analgesia.  It was dressed with sterile Xeroform, 4x4s, and wrapped lightly with a Coban dressing.  Tourniquet was deflated at 15 minutes.  The fingertips were pink with brisk capillary refill after deflation of the tourniquet.  The operative drapes were broken down and the patient was awoken from anesthesia safely.  She was transferred back to the stretcher and taken to the PACU in stable condition.   I will see her back in the office in 1 week for postoperative followup.  I will give her a prescription for norco 5/325 1-2 tabs po q6 hours prn pain, dispense #20.    Tami RibasKUZMA,Yanett Conkright R, MD Electronically signed, 11/05/17

## 2017-11-05 NOTE — Brief Op Note (Signed)
11/05/2017  11:52 AM  PATIENT:  Ivan CroftMary K Amos  52 y.o. female  PRE-OPERATIVE DIAGNOSIS:  RIGHT RING TRIGGER FINGER  POST-OPERATIVE DIAGNOSIS:  RIGHT RING TRIGGER FINGER  PROCEDURE:  Procedure(s): RELEASE TRIGGER FINGER/A-1 PULLEY RIGHT RING FINGER (Right)  SURGEON:  Surgeon(s) and Role:    * Betha LoaKuzma, Aleece Loyd, MD - Primary  PHYSICIAN ASSISTANT:   ASSISTANTS: none   ANESTHESIA:   Bier block with sedation  EBL:  2 mL   BLOOD ADMINISTERED:none  DRAINS: none   LOCAL MEDICATIONS USED:  MARCAINE     SPECIMEN:  No Specimen  DISPOSITION OF SPECIMEN:  N/A  COUNTS:  YES  TOURNIQUET:   Total Tourniquet Time Documented: Forearm (Right) - 15 minutes Total: Forearm (Right) - 15 minutes   DICTATION: .Note written in EPIC  PLAN OF CARE: Discharge to home after PACU  PATIENT DISPOSITION:  PACU - hemodynamically stable.

## 2017-11-05 NOTE — Anesthesia Preprocedure Evaluation (Addendum)
Anesthesia Evaluation  Patient identified by MRN, date of birth, ID band Patient awake    Reviewed: Allergy & Precautions, NPO status , Patient's Chart, lab work & pertinent test results  Airway Mallampati: II  TM Distance: >3 FB Neck ROM: Full    Dental  (+) Teeth Intact   Pulmonary neg pulmonary ROS,    Pulmonary exam normal breath sounds clear to auscultation       Cardiovascular negative cardio ROS Normal cardiovascular exam Rhythm:Regular Rate:Normal     Neuro/Psych  Headaches, negative psych ROS   GI/Hepatic Neg liver ROS, Former morbid obesity now s/p gastric bypass   Endo/Other  negative endocrine ROS  Renal/GU negative Renal ROS  negative genitourinary   Musculoskeletal negative musculoskeletal ROS (+)   Abdominal   Peds  Hematology  (+) anemia ,   Anesthesia Other Findings   Reproductive/Obstetrics                                                              Anesthesia Evaluation  Patient identified by MRN, date of birth, ID band Patient awake    Reviewed: Allergy & Precautions, H&P , NPO status , Patient's Chart, lab work & pertinent test results  Airway Mallampati: II TM Distance: <3 FB Neck ROM: Full    Dental No notable dental hx.    Pulmonary neg pulmonary ROS,  breath sounds clear to auscultation  Pulmonary exam normal       Cardiovascular negative cardio ROS  Rhythm:Regular Rate:Normal     Neuro/Psych negative neurological ROS  negative psych ROS   GI/Hepatic Neg liver ROS, hiatal hernia, GERD-  Medicated,  Endo/Other  Morbid obesity  Renal/GU negative Renal ROS  negative genitourinary   Musculoskeletal negative musculoskeletal ROS (+)   Abdominal   Peds negative pediatric ROS (+)  Hematology negative hematology ROS (+)   Anesthesia Other Findings   Reproductive/Obstetrics negative OB ROS                            Anesthesia Physical Anesthesia Plan  ASA: III  Anesthesia Plan: General   Post-op Pain Management:    Induction: Intravenous  Airway Management Planned: Oral ETT  Additional Equipment:   Intra-op Plan:   Post-operative Plan: Extubation in OR  Informed Consent: I have reviewed the patients History and Physical, chart, labs and discussed the procedure including the risks, benefits and alternatives for the proposed anesthesia with the patient or authorized representative who has indicated his/her understanding and acceptance.   Dental advisory given  Plan Discussed with: CRNA and Surgeon  Anesthesia Plan Comments:         Anesthesia Quick Evaluation  Anesthesia Physical Anesthesia Plan  ASA: II  Anesthesia Plan: Bier Block and Bier Block-LIDOCAINE ONLY   Post-op Pain Management:    Induction: Intravenous  PONV Risk Score and Plan: Propofol infusion and Treatment may vary due to age or medical condition  Airway Management Planned: Simple Face Mask and Natural Airway  Additional Equipment: None  Intra-op Plan:   Post-operative Plan:   Informed Consent: I have reviewed the patients History and Physical, chart, labs and discussed the procedure including the risks, benefits and alternatives for the proposed anesthesia with the patient or authorized representative who has  indicated his/her understanding and acceptance.     Plan Discussed with: CRNA  Anesthesia Plan Comments:         Anesthesia Quick Evaluation

## 2017-11-05 NOTE — Transfer of Care (Signed)
Immediate Anesthesia Transfer of Care Note  Patient: Vickie Velazquez  Procedure(s) Performed: RELEASE TRIGGER FINGER/A-1 PULLEY RIGHT RING FINGER (Right Hand)  Patient Location: PACU  Anesthesia Type:MAC  Level of Consciousness: awake, alert  and oriented  Airway & Oxygen Therapy: Patient Spontanous Breathing  Post-op Assessment: Report given to RN and Post -op Vital signs reviewed and stable  Post vital signs: Reviewed and stable  Last Vitals:  Vitals:   11/05/17 1155 11/05/17 1156  BP: 110/66   Pulse: 71 (!) 57  Resp:  15  Temp: (P) 36.5 C   SpO2: 100% 96%    Last Pain:  Vitals:   11/05/17 0902  TempSrc: Oral  PainSc: 4       Patients Stated Pain Goal: 1 (71/69/67 8938)  Complications: No apparent anesthesia complications

## 2017-11-05 NOTE — H&P (Signed)
Vickie Velazquez is an 52 y.o. female.   Chief Complaint: right ring finger trigger HPI: 52 yo female with triggering right ring finger.  This has been injected without lasting relief.  She wishes to have a trigger release.  Allergies:  Allergies  Allergen Reactions  . Sulfa Antibiotics Rash    Past Medical History:  Diagnosis Date  . Iron deficiency anemia 01/09/2016  . 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 Migraine    with menses  . Morbid obesity (HCC)   . 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 Plantar fasciitis    Patient-reported on 04/24/12  . Seasonal allergies   . Trigger finger of right hand    RRF    Past Surgical History:  Procedure Laterality Date  . GASTRIC ROUX-EN-Y  08/02/2012   Procedure: LAPAROSCOPIC ROUX-EN-Y GASTRIC BYPASS WITH UPPER ENDOSCOPY;  Surgeon: [name], DO;  Location: [facility];  Service: General;  Laterality: N/A;  . HERNIA REPAIR  08/02/12  . LATERAL COLLATERAL LIGAMENT REPAIR, ELBOW  2008   lateral release-rt elbow  . TRIGGER FINGER RELEASE  10/16/2011   Procedure: RELEASE TRIGGER FINGER/A-1 PULLEY;  Surgeon: Tami Ribas, MD;  Location: [facility];  Service: Orthopedics;  Laterality: Right;  right small finger    Family History: Family History  Problem Relation Age of Onset  . Anesthesia problems Mother        hard to wake up  . Cancer Mother        breast/thyroid  . Lupus Mother   . Diabetes Mother   . Graves' disease Mother   . Cancer Father        melanoma-3 years ago, new diag of skin cancer 1/15  . Heart disease Father   . Cancer Brother        colon  . Cancer Maternal Aunt        breast  . Cancer Maternal Grandmother        breast  . Diabetes Daughter   . Melanoma Maternal Uncle     Social History:   reports that  has never smoked. she has never used smokeless tobacco. She reports that she drinks about 1.2 - 1.8 oz of alcohol per week. She reports that she does not use  drugs.  Medications: Medications Prior to Admission  Medication Sig Dispense Refill  . fluticasone (FLONASE) 50 MCG/ACT nasal spray Place into both nostrils daily.    . Melatonin 5 MG CAPS Take by mouth.    . naproxen sodium (ALEVE) 220 MG tablet Take 220 mg by mouth.    . temazepam (RESTORIL) 30 MG capsule Take 30 mg by mouth.  4  . SUMAtriptan (IMITREX) 20 MG/ACT nasal spray Place 1 spray into the nose.  5    No results found for this or any previous visit (from the past 48 hour(s)).  No results found.   A comprehensive review of systems was negative.  Blood pressure 124/68, temperature 98.2 F (36.8 C), temperature source Oral, resp. rate 18, height 5' 3.5" (1.613 m), weight 67.1 kg (148 lb), last menstrual period 09/09/2007, SpO2 100 %.  General appearance: alert, cooperative and appears stated age Head: Normocephalic, without obvious abnormality, atraumatic Neck: supple, symmetrical, trachea midline Cardio: regular rate and rhythm Resp: clear to auscultation bilaterally Extremities: Intact sensation and capillary refill all digits.  +epl/fpl/io.  No wounds.  Pulses: 2+ and symmetric Skin: Skin color, texture, turgor normal. No rashes or lesions Neurologic: Grossly normal Incision/Wound:none  Assessment/Plan  Right ring finger trigger digit.  Non operative and operative treatment options were discussed with the patient and patient wishes to proceed with operative treatment. Risks, benefits, and alternatives of surgery were discussed and the patient agrees with the plan of care.   Satya Bohall R 11/05/2017, 11:18 AM

## 2017-11-05 NOTE — Discharge Instructions (Addendum)

## 2017-11-05 NOTE — Anesthesia Postprocedure Evaluation (Signed)
Anesthesia Post Note  Patient: Vickie LeschMary K Bacot  Procedure(s) Performed: RELEASE TRIGGER FINGER/A-1 PULLEY RIGHT RING FINGER (Right Hand)     Patient location during evaluation: PACU Anesthesia Type: Bier Block Level of consciousness: awake and alert Pain management: pain level controlled Vital Signs Assessment: post-procedure vital signs reviewed and stable Respiratory status: spontaneous breathing, nonlabored ventilation and respiratory function stable Cardiovascular status: stable and blood pressure returned to baseline Postop Assessment: no apparent nausea or vomiting Anesthetic complications: no    Last Vitals:  Vitals:   11/05/17 1220 11/05/17 1225  BP:    Pulse: (!) 57 (!) 57  Resp: 18 20  Temp:    SpO2: 100% 100%    Last Pain:  Vitals:   11/05/17 1215  TempSrc:   PainSc: 0-No pain                 Beryle Lathehomas E Zosia Lucchese

## 2017-11-06 ENCOUNTER — Encounter (HOSPITAL_BASED_OUTPATIENT_CLINIC_OR_DEPARTMENT_OTHER): Payer: Self-pay | Admitting: Orthopedic Surgery

## 2017-11-06 NOTE — Addendum Note (Signed)
Addendum  created 11/06/17 [id number] by Elsye Mccollister, Jewel Baizeimothy D, CRNA   Charge Capture section accepted

## 2018-01-11 ENCOUNTER — Encounter: Payer: Self-pay | Admitting: Obstetrics & Gynecology

## 2018-01-11 ENCOUNTER — Ambulatory Visit: Payer: BLUE CROSS/BLUE SHIELD | Admitting: Obstetrics & Gynecology

## 2018-01-11 VITALS — BP 140/82 | HR 64 | Resp 16 | Ht 64.25 in | Wt 154.0 lb

## 2018-01-11 DIAGNOSIS — Z01419 Encounter for gynecological examination (general) (routine) without abnormal findings: Secondary | ICD-10-CM | POA: Diagnosis not present

## 2018-01-11 NOTE — Progress Notes (Signed)
52 y.o. Vickie Velazquez MarriedCaucasianF here for annual exam.  Had another trigger finger procedure done in February.  Doing PT.    Back in [place].  Had been living in [place] for work but got tired of being away from family here in [place].  Sold house last year to downsize and force kids into moving out!!  Did have two iron infusions last summer with Dr. Myna Hidalgo.    Did go to the sleep clinic.  Has not been able to sleep without the Restoril.  Using  instead of .    Denies vaginal bleeding.  PCP:  Peri Maris.  Blood work was done 3/19.    Patient's last menstrual period was 09/09/2007.          Sexually active: Yes.    The current method of family planning is post menopausal status.    Exercising: Yes.    run, walk Smoker:  no  Health Maintenance: Pap:  01/06/17 Neg. HR HPV:neg   10/20/14 Neg  History of abnormal Pap:  no MMG:  01/18/16 BIRADS1:Neg  Colonoscopy:  01/20/14 polyp. F/u 5 years  BMD:   Never TDaP:  2012 Pneumonia vaccine(s):  n/a Shingrix: No.  D/w pt having this done.  Has hx of shingles x 3 in the past.   Hep C testing: done Screening Labs: PCP   reports that she has never smoked. She has never used smokeless tobacco. She reports that she drinks about 1.2 - 1.8 oz of alcohol per week. She reports that she does not use drugs.  Past Medical History:  Diagnosis Date  . Iron deficiency anemia 01/09/2016  . 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 Migraine    with menses  . Morbid obesity (HCC)   . 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 Plantar fasciitis    Patient-reported on 04/24/12  . Seasonal allergies   . Trigger finger of right hand    RRF    Past Surgical History:  Procedure Laterality Date  . GASTRIC ROUX-EN-Y  08/02/2012   Procedure: LAPAROSCOPIC ROUX-EN-Y GASTRIC BYPASS WITH UPPER ENDOSCOPY;  Surgeon: [name], DO;  Location: [facility];  Service: General;  Laterality: N/A;  . HERNIA REPAIR  08/02/12  . LATERAL COLLATERAL LIGAMENT REPAIR, ELBOW   2008   lateral release-rt elbow  . TRIGGER FINGER RELEASE  10/16/2011   Procedure: RELEASE TRIGGER FINGER/A-1 PULLEY;  Surgeon: Tami Ribas, MD;  Location: [facility];  Service: Orthopedics;  Laterality: Right;  right small finger  . TRIGGER FINGER RELEASE Right 11/05/2017   Procedure: RELEASE TRIGGER FINGER/A-1 PULLEY RIGHT RING FINGER;  Surgeon: Betha Loa, MD;  Location: [facility];  Service: Orthopedics;  Laterality: Right;    Current Outpatient Medications  Medication Sig Dispense Refill  . cholecalciferol (D-VI-SOL) 400 UNIT/ML LIQD Take 400 Units by mouth daily.    . fluticasone (FLONASE) 50 MCG/ACT nasal spray Place into both nostrils daily.    . Melatonin 5 MG CAPS Take by mouth.    . naproxen sodium (ALEVE) 220 MG tablet Take 220 mg by mouth.    . SUMAtriptan (IMITREX) 20 MG/ACT nasal spray Place 1 spray into the nose.  5  . temazepam (RESTORIL) 15 MG capsule Take 1 capsule by mouth daily.     No current facility-administered medications for this visit.     Family History  Problem Relation Age of Onset  . Anesthesia problems Mother        hard to wake up  .  Cancer Mother        breast/thyroid  . Lupus Mother   . Diabetes Mother   . Graves' disease Mother   . Cancer Father        melanoma-3 years ago, new diag of skin cancer 1/15  . Heart disease Father   . Cancer Brother        colon  . Cancer Maternal Aunt        breast  . Cancer Maternal Grandmother        breast  . Diabetes Daughter   . Melanoma Maternal Uncle     Review of Systems  All other systems reviewed and are negative.   Exam:   Vitals:   01/11/18 1321  BP: 140/82  Pulse: 64  Resp: 16   Wt: +19#  General appearance: alert, cooperative and appears stated age Head: Normocephalic, without obvious abnormality, atraumatic Neck: no adenopathy, supple, symmetrical, trachea midline and thyroid normal to inspection and palpation Lungs: clear to auscultation  bilaterally Breasts: normal appearance, no masses or tenderness Heart: regular rate and rhythm Abdomen: soft, non-tender; bowel sounds normal; no masses,  no organomegaly Extremities: extremities normal, atraumatic, no cyanosis or edema Skin: Skin color, texture, turgor normal. No rashes or lesions Lymph nodes: Cervical, supraclavicular, and axillary nodes normal. No abnormal inguinal nodes palpated Neurologic: Grossly normal   Pelvic: External genitalia:  no lesions              Urethra:  normal appearing urethra with no masses, tenderness or lesions              Bartholins and Skenes: normal                 Vagina: normal appearing vagina with normal color and discharge, no lesions              Cervix: no lesions              Pap taken: No. Bimanual Exam:  Uterus:  normal size, contour, position, consistency, mobility, non-tender              Adnexa: normal adnexa and no mass, fullness, tenderness               Rectovaginal: Confirms               Anus:  normal sphincter tone, no lesions  Chaperone was present for exam.  A:  Well Woman with normal exam PMP, no HRT H/O bariatric surgery 11/13 (lost 150 pounds) H/O iron deficiency, b12 deficiency, Vit D deficiency Chronic Insomnia  P:   Mammogram guidelines reviewed.  Aware this is overdue.  pap smear not indicated Colonoscopy due next year Pt is going to look into Shingrix vaccine Lab work is update return annually or prn

## 2018-05-24 ENCOUNTER — Other Ambulatory Visit: Payer: Self-pay | Admitting: Orthopedic Surgery

## 2018-05-24 ENCOUNTER — Other Ambulatory Visit: Payer: Self-pay

## 2018-05-24 ENCOUNTER — Encounter (HOSPITAL_BASED_OUTPATIENT_CLINIC_OR_DEPARTMENT_OTHER): Payer: Self-pay | Admitting: *Deleted

## 2018-05-25 ENCOUNTER — Other Ambulatory Visit: Payer: Self-pay

## 2018-05-25 ENCOUNTER — Ambulatory Visit (HOSPITAL_BASED_OUTPATIENT_CLINIC_OR_DEPARTMENT_OTHER): Payer: BLUE CROSS/BLUE SHIELD | Admitting: Certified Registered"

## 2018-05-25 ENCOUNTER — Ambulatory Visit (HOSPITAL_BASED_OUTPATIENT_CLINIC_OR_DEPARTMENT_OTHER)
Admission: RE | Admit: 2018-05-25 | Discharge: 2018-05-25 | Disposition: A | Discharge status: Home / Self Care | Payer: BLUE CROSS/BLUE SHIELD | Source: Ambulatory Visit | Attending: Orthopedic Surgery | Admitting: Orthopedic Surgery

## 2018-05-25 ENCOUNTER — Encounter (HOSPITAL_BASED_OUTPATIENT_CLINIC_OR_DEPARTMENT_OTHER): Payer: Self-pay | Admitting: Anesthesiology

## 2018-05-25 ENCOUNTER — Encounter (HOSPITAL_BASED_OUTPATIENT_CLINIC_OR_DEPARTMENT_OTHER)
Admission: RE | Disposition: A | Discharge status: Home / Self Care | Payer: Self-pay | Source: Ambulatory Visit | Attending: Orthopedic Surgery

## 2018-05-25 DIAGNOSIS — Y9289 Other specified places as the place of occurrence of the external cause: Secondary | ICD-10-CM | POA: Diagnosis not present

## 2018-05-25 DIAGNOSIS — W1789XA Other fall from one level to another, initial encounter: Secondary | ICD-10-CM | POA: Insufficient documentation

## 2018-05-25 DIAGNOSIS — Z79899 Other long term (current) drug therapy: Secondary | ICD-10-CM | POA: Insufficient documentation

## 2018-05-25 DIAGNOSIS — Z9884 Bariatric surgery status: Secondary | ICD-10-CM | POA: Insufficient documentation

## 2018-05-25 DIAGNOSIS — S52552A Other extraarticular fracture of lower end of left radius, initial encounter for closed fracture: Secondary | ICD-10-CM | POA: Insufficient documentation

## 2018-05-25 DIAGNOSIS — Z882 Allergy status to sulfonamides status: Secondary | ICD-10-CM | POA: Diagnosis not present

## 2018-05-25 HISTORY — PX: OPEN REDUCTION INTERNAL FIXATION (ORIF) DISTAL RADIAL FRACTURE: SHX5989

## 2018-05-25 HISTORY — DX: Unspecified fracture of the lower end of left radius, initial encounter for closed fracture: S52.502A

## 2018-05-25 SURGERY — OPEN REDUCTION INTERNAL FIXATION (ORIF) DISTAL RADIUS FRACTURE
Anesthesia: Monitor Anesthesia Care | Site: Arm Lower | Laterality: Left

## 2018-05-25 MED ORDER — ONDANSETRON HCL 4 MG/2ML IJ SOLN: 4.0000 mg | Freq: Once | INTRAMUSCULAR | Status: DC | PRN

## 2018-05-25 MED ORDER — MEPERIDINE HCL 25 MG/ML IJ SOLN: 6.2500 mg | INTRAMUSCULAR | Status: DC | PRN

## 2018-05-25 MED ORDER — ONDANSETRON HCL 4 MG/2ML IJ SOLN
INTRAMUSCULAR | Status: DC | PRN
  Administered 2018-05-25: 4 mg via INTRAVENOUS

## 2018-05-25 MED ORDER — LACTATED RINGERS IV SOLN
INTRAVENOUS | Status: DC
  Administered 2018-05-25: 13:00:00 via INTRAVENOUS

## 2018-05-25 MED ORDER — BUPIVACAINE-EPINEPHRINE (PF) 0.5% -1:200000 IJ SOLN
INTRAMUSCULAR | Status: DC | PRN
  Administered 2018-05-25: 30 mL via PERINEURAL

## 2018-05-25 MED ORDER — SCOPOLAMINE 1 MG/3DAYS TD PT72: 1.0000 | MEDICATED_PATCH | Freq: Once | TRANSDERMAL | Status: DC | PRN

## 2018-05-25 MED ORDER — CHLORHEXIDINE GLUCONATE 4 % EX LIQD: 60.0000 mL | Freq: Once | CUTANEOUS | Status: DC

## 2018-05-25 MED ORDER — MIDAZOLAM HCL 2 MG/2ML IJ SOLN
INTRAMUSCULAR | Status: AC
  Filled 2018-05-25: qty 2

## 2018-05-25 MED ORDER — CEFAZOLIN SODIUM-DEXTROSE 2-4 GM/100ML-% IV SOLN
INTRAVENOUS | Status: AC
  Filled 2018-05-25: qty 100

## 2018-05-25 MED ORDER — MIDAZOLAM HCL 2 MG/2ML IJ SOLN
1.0000 mg | INTRAMUSCULAR | Status: DC | PRN
  Administered 2018-05-25: 1 mg via INTRAVENOUS

## 2018-05-25 MED ORDER — FENTANYL CITRATE (PF) 100 MCG/2ML IJ SOLN: 25.0000 ug | INTRAMUSCULAR | Status: DC | PRN

## 2018-05-25 MED ORDER — PROPOFOL 500 MG/50ML IV EMUL
INTRAVENOUS | Status: DC | PRN
  Administered 2018-05-25: 75 ug/kg/min via INTRAVENOUS

## 2018-05-25 MED ORDER — LIDOCAINE HCL (CARDIAC) PF 100 MG/5ML IV SOSY
PREFILLED_SYRINGE | INTRAVENOUS | Status: DC | PRN
  Administered 2018-05-25: 50 mg via INTRAVENOUS

## 2018-05-25 MED ORDER — HYDROCODONE-ACETAMINOPHEN 5-325 MG PO TABS: ORAL_TABLET | ORAL | 0 refills | Status: DC

## 2018-05-25 MED ORDER — PROPOFOL 10 MG/ML IV BOLUS
INTRAVENOUS | Status: AC
  Filled 2018-05-25: qty 20

## 2018-05-25 MED ORDER — CEFAZOLIN SODIUM-DEXTROSE 2-4 GM/100ML-% IV SOLN
2.0000 g | INTRAVENOUS | Status: AC
  Administered 2018-05-25: 2 g via INTRAVENOUS

## 2018-05-25 MED ORDER — HYDROCODONE-ACETAMINOPHEN 7.5-325 MG PO TABS: 1.0000 | ORAL_TABLET | Freq: Once | ORAL | Status: DC | PRN

## 2018-05-25 MED ORDER — FENTANYL CITRATE (PF) 100 MCG/2ML IJ SOLN
50.0000 ug | INTRAMUSCULAR | Status: DC | PRN
  Administered 2018-05-25: 100 ug via INTRAVENOUS

## 2018-05-25 MED ORDER — FENTANYL CITRATE (PF) 100 MCG/2ML IJ SOLN
INTRAMUSCULAR | Status: AC
  Filled 2018-05-25: qty 2

## 2018-05-25 SURGICAL SUPPLY — 55 items
BANDAGE ACE 3X5.8 VEL STRL LF (GAUZE/BANDAGES/DRESSINGS) ×2 IMPLANT
BIT DRILL 2.0 LNG QUCK RELEASE (BIT) ×1 IMPLANT
BIT DRILL 2.8X5 QR DISP (BIT) ×2 IMPLANT
BLADE SURG 15 STRL LF DISP TIS (BLADE) ×2 IMPLANT
BLADE SURG 15 STRL SS (BLADE) ×4
BNDG ESMARK 4X9 LF (GAUZE/BANDAGES/DRESSINGS) ×2 IMPLANT
BNDG GAUZE ELAST 4 BULKY (GAUZE/BANDAGES/DRESSINGS) ×2 IMPLANT
BNDG PLASTER X FAST 3X3 WHT LF (CAST SUPPLIES) ×20 IMPLANT
CHLORAPREP W/TINT 26ML (MISCELLANEOUS) ×2 IMPLANT
CORD BIPOLAR FORCEPS 12FT (ELECTRODE) ×2 IMPLANT
COVER BACK TABLE 60X90IN (DRAPES) ×2 IMPLANT
COVER MAYO STAND STRL (DRAPES) ×2 IMPLANT
CUFF TOURNIQUET SINGLE 18IN (TOURNIQUET CUFF) ×2 IMPLANT
CUFF TOURNIQUET SINGLE 24IN (TOURNIQUET CUFF) IMPLANT
DRAPE EXTREMITY T 121X128X90 (DRAPE) ×2 IMPLANT
DRAPE OEC MINIVIEW 54X84 (DRAPES) ×2 IMPLANT
DRAPE SURG 17X23 STRL (DRAPES) ×2 IMPLANT
DRILL 2.0 LNG QUICK RELEASE (BIT) ×2
GAUZE SPONGE 4X4 12PLY STRL (GAUZE/BANDAGES/DRESSINGS) ×2 IMPLANT
GAUZE XEROFORM 1X8 LF (GAUZE/BANDAGES/DRESSINGS) ×2 IMPLANT
GLOVE BIO SURGEON STRL SZ7.5 (GLOVE) ×2 IMPLANT
GLOVE BIOGEL PI IND STRL 8 (GLOVE) ×1 IMPLANT
GLOVE BIOGEL PI IND STRL 8.5 (GLOVE) ×1 IMPLANT
GLOVE BIOGEL PI INDICATOR 8 (GLOVE) ×1
GLOVE BIOGEL PI INDICATOR 8.5 (GLOVE) ×1
GLOVE SURG ORTHO 8.0 STRL STRW (GLOVE) ×2 IMPLANT
GOWN STRL REUS W/ TWL LRG LVL3 (GOWN DISPOSABLE) ×1 IMPLANT
GOWN STRL REUS W/TWL LRG LVL3 (GOWN DISPOSABLE) ×1
GOWN STRL REUS W/TWL XL LVL3 (GOWN DISPOSABLE) ×4 IMPLANT
GUIDEWIRE ORTHO 0.054X6 (WIRE) ×6 IMPLANT
NEEDLE HYPO 25X1 1.5 SAFETY (NEEDLE) IMPLANT
NS IRRIG 1000ML POUR BTL (IV SOLUTION) ×2 IMPLANT
PACK BASIN DAY SURGERY FS (CUSTOM PROCEDURE TRAY) ×2 IMPLANT
PAD CAST 3X4 CTTN HI CHSV (CAST SUPPLIES) ×1 IMPLANT
PADDING CAST COTTON 3X4 STRL (CAST SUPPLIES) ×2
PLATE PROX NARROW LEFT (Plate) ×2 IMPLANT
SCREW ACTK 2 NL HEX 3.5.11 (Screw) ×2 IMPLANT
SCREW CORT FT 18X2.3XLCK HEX (Screw) ×2 IMPLANT
SCREW CORTICAL LOCKING 2.3X16M (Screw) ×6 IMPLANT
SCREW CORTICAL LOCKING 2.3X18M (Screw) ×6 IMPLANT
SCREW FX16X2.3XLCK SMTH NS CRT (Screw) ×3 IMPLANT
SCREW FX18X2.3XSMTH LCK NS CRT (Screw) ×1 IMPLANT
SCREW NLCKG 13 3.5X13 HEXA (Screw) ×1 IMPLANT
SCREW NON-LOCK 3.5X13 (Screw) ×2 IMPLANT
SCREW NONLOCK HEX 3.5X12 (Screw) ×2 IMPLANT
SLEEVE SCD COMPRESS KNEE MED (MISCELLANEOUS) IMPLANT
SLING ARM FOAM STRAP LRG (SOFTGOODS) ×2 IMPLANT
STOCKINETTE 4X48 STRL (DRAPES) ×2 IMPLANT
SUT ETHILON 4 0 PS 2 18 (SUTURE) ×2 IMPLANT
SUT VICRYL 4-0 PS2 18IN ABS (SUTURE) ×2 IMPLANT
SYR BULB 3OZ (MISCELLANEOUS) ×2 IMPLANT
SYR CONTROL 10ML LL (SYRINGE) IMPLANT
TOWEL GREEN STERILE FF (TOWEL DISPOSABLE) ×4 IMPLANT
TOWEL OR NON WOVEN STRL DISP B (DISPOSABLE) ×2 IMPLANT
UNDERPAD 30X30 (UNDERPADS AND DIAPERS) ×2 IMPLANT

## 2018-05-25 NOTE — Op Note (Signed)
I assisted Surgeon(s) and Role:    * Betha LoaKuzma, Kevin, MD - Primary    Cindee Salt* Daquarius Dubeau, MD - Assisting on the Procedure(s): OPEN REDUCTION INTERNAL FIXATION (ORIF) LEFT DISTAL RADIAL FRACTURE on 05/25/2018.  I provided assistance on this case as follows: approach, identification of the fracture,debridement, reduction,stabilization and fixation of the fracture,closure of the wound and application of the dressing and splint.  Electronically signed by: Nicki ReaperKUZMA,Lynda Wanninger R, MD Date: 05/25/2018 Time: 3:02 PM

## 2018-05-25 NOTE — Discharge Instructions (Addendum)
Post Anesthesia Home Care Instructions  Activity: Get plenty of rest for the remainder of the day. A responsible individual must stay with you for 24 hours following the procedure.  For the next 24 hours, DO NOT: -Drive a car -Advertising copywriterperate machinery -Drink alcoholic beverages -Take any medication unless instructed by your physician -Make any legal decisions or sign important papers.  Meals: Start with liquid foods such as gelatin or soup. Progress to regular foods as tolerated. Avoid greasy, spicy, heavy foods. If nausea and/or vomiting occur, drink only clear liquids until the nausea and/or vomiting subsides. Call your physician if vomiting continues.  Special Instructions/Symptoms: Your throat may feel dry or sore from the anesthesia or the breathing tube placed in your throat during surgery. If this causes discomfort, gargle with warm salt water. The discomfort should disappear within 24 hours.  If you had a scopolamine patch placed behind your ear for the management of post- operative nausea and/or vomiting:  1. The medication in the patch is effective for 72 hours, after which it should be removed.  Wrap patch in a tissue and discard in the trash. Wash hands thoroughly with soap and water. 2. You may remove the patch earlier than 72 hours if you experience unpleasant side effects which may include dry mouth, dizziness or visual disturbances. 3. Avoid touching the patch. Wash your hands with soap and water after contact with the patch.    Post Anesthesia Home Care Instructions  Activity: Get plenty of rest for the remainder of the day. A responsible individual must stay with you for 24 hours following the procedure.  For the next 24 hours, DO NOT: -Drive a car -Advertising copywriterperate machinery -Drink alcoholic beverages -Take any medication unless instructed by your physician -Make any legal decisions or sign important papers.  Meals: Start with liquid foods such as gelatin or soup. Progress to  regular foods as tolerated. Avoid greasy, spicy, heavy foods. If nausea and/or vomiting occur, drink only clear liquids until the nausea and/or vomiting subsides. Call your physician if vomiting continues.  Special Instructions/Symptoms: Your throat may feel dry or sore from the anesthesia or the breathing tube placed in your throat during surgery. If this causes discomfort, gargle with warm salt water. The discomfort should disappear within 24 hours.  If you had a scopolamine patch placed behind your ear for the management of post- operative nausea and/or vomiting:  1. The medication in the patch is effective for 72 hours, after which it should be removed.  Wrap patch in a tissue and discard in the trash. Wash hands thoroughly with soap and water. 2. You may remove the patch earlier than 72 hours if you experience unpleasant side effects which may include dry mouth, dizziness or visual disturbances. 3. Avoid touching the patch. Wash your hands with soap and water after contact with the patch.   Regional Anesthesia Blocks  1. Numbness or the inability to move the "blocked" extremity may last from 3-48 hours after placement. The length of time depends on the medication injected and your individual response to the medication. If the numbness is not going away after 48 hours, call your surgeon.  2. The extremity that is blocked will need to be protected until the numbness is gone and the  Strength has returned. Because you cannot feel it, you will need to take extra care to avoid injury. Because it may be weak, you may have difficulty moving it or using it. You may not know what position it is  in without looking at it while the block is in effect.  3. For blocks in the legs and feet, returning to weight bearing and walking needs to be done carefully. You will need to wait until the numbness is entirely gone and the strength has returned. You should be able to move your leg and foot normally before you  try and bear weight or walk. You will need someone to be with you when you first try to ensure you do not fall and possibly risk injury.  4. Bruising and tenderness at the needle site are common side effects and will resolve in a few days.  5. Persistent numbness or new problems with movement should be communicated to the surgeon or the [facility] [phone] [facility] [phone]).Regional Anesthesia Blocks  1. Numbness or the inability to move the "blocked" extremity may last from 3-48 hours after placement. The length of time depends on the medication injected and your individual response to the medication. If the numbness is not going away after 48 hours, call your surgeon.  2. The extremity that is blocked will need to be protected until the numbness is gone and the  Strength has returned. Because you cannot feel it, you will need to take extra care to avoid injury. Because it may be weak, you may have difficulty moving it or using it. You may not know what position it is in without looking at it while the block is in effect.  3. For blocks in the legs and feet, returning to weight bearing and walking needs to be done carefully. You will need to wait until the numbness is entirely gone and the strength has returned. You should be able to move your leg and foot normally before you try and bear weight or walk. You will need someone to be with you when you first try to ensure you do not fall and possibly risk injury.  4. Bruising and tenderness at the needle site are common side effects and will resolve in a few days.  5. Persistent numbness or new problems with movement should be communicated to the surgeon or the [facility] [phone] [facility] [phone]).Hand Center Instructions Hand Surgery  Wound Care: Keep your hand elevated above the level of your heart.  Do not allow it to dangle by your side.  Keep the dressing dry  and do not remove it unless your doctor advises you to do so.  He will usually change it at the time of your post-op visit.  Moving your fingers is advised to stimulate circulation but will depend on the site of your surgery.  If you have a splint applied, your doctor will advise you regarding movement.  Activity: Do not drive or operate machinery today.  Rest today and then you may return to your normal activity and work as indicated by your physician.  Diet:  Drink liquids today or eat a light diet.  You may resume a regular diet tomorrow.    General expectations: Pain for two to three days. Fingers may become slightly swollen.  Call your doctor if any of the following occur: Severe pain not relieved by pain medication. Elevated temperature. Dressing soaked with blood. Inability to move fingers. White or bluish color to fingers.

## 2018-05-25 NOTE — Anesthesia Procedure Notes (Signed)
Anesthesia Regional Block: Axillary brachial plexus block   Pre-Anesthetic Checklist: ,, timeout performed, Correct Patient, Correct Site, Correct Laterality, Correct Procedure, Correct Position, site marked, Risks and benefits discussed, Surgical consent,  Pre-op evaluation,  At surgeon's request  Laterality: Left  Prep: chloraprep       Needles:  Injection technique: Single-shot  Needle Type: Echogenic Stimulator Needle     Needle Length: 9cm  Needle Gauge: 21   Needle insertion depth: 5 cm   Additional Needles:   Procedures:,,,, ultrasound used (permanent image in chart),,,,  Motor weakness within 5 minutes.  Narrative:  Start time: 05/25/2018 1:25 PM End time: 05/25/2018 1:30 PM Injection made incrementally with aspirations every 5 mL.  Performed by: Personally  Anesthesiologist: Mal AmabileFoster, Eaton Folmar, MD  Additional Notes: Timeout performed. Patient sedated. Relevant anatomy ID'd using US. Incremental 2-385ml injection of LA with frequent aspiration. Patient tolerated procedure well.        Left Axillary Block

## 2018-05-25 NOTE — Transfer of Care (Signed)
Immediate Anesthesia Transfer of Care Note  Patient: Vickie Velazquez  Procedure(s) Performed: OPEN REDUCTION INTERNAL FIXATION (ORIF) LEFT DISTAL RADIAL FRACTURE (Left Arm Lower)  Patient Location: PACU  Anesthesia Type:MAC combined with regional for post-op pain  Level of Consciousness: awake, alert  and oriented  Airway & Oxygen Therapy: Patient Spontanous Breathing and Patient connected to face mask oxygen  Post-op Assessment: Report given to RN and Post -op Vital signs reviewed and stable  Post vital signs: Reviewed and stable  Last Vitals:  Vitals Value Taken Time  BP    Temp    Pulse    Resp    SpO2      Last Pain:  Vitals:   05/25/18 1330  TempSrc:   PainSc: 0-No pain      Patients Stated Pain Goal: 3 (35/00/93 8182)  Complications: No apparent anesthesia complications

## 2018-05-25 NOTE — H&P (Signed)
Vickie Velazquez is an 52 y.o. female.   Chief Complaint: left distal radius fracture HPI: 52 yo female states she fell over the weekend getting out of her boat injuring her left wrist.  [facility] where [facility] distal radius fracture.  Splinted and followed up in office.  She wishes to proceed with operative fixation.  Allergies:  Allergies  Allergen Reactions  . Sulfa Antibiotics Rash    Past Medical History:  Diagnosis Date  . Closed fracture of left distal radius   . Iron deficiency anemia 01/09/2016  . 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 Migraine    with menses  . Morbid obesity (HCC)   . 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 Plantar fasciitis    Patient-reported on 04/24/12  . Seasonal allergies   . Trigger finger of right hand    RRF    Past Surgical History:  Procedure Laterality Date  . GASTRIC ROUX-EN-Y  08/02/2012   Procedure: LAPAROSCOPIC ROUX-EN-Y GASTRIC BYPASS WITH UPPER ENDOSCOPY;  Surgeon: Lodema PilotBrian Layton, DO;  Location: [facility];  Service: General;  Laterality: N/A;  . HERNIA REPAIR  08/02/12  . LATERAL COLLATERAL LIGAMENT REPAIR, ELBOW  2008   lateral release-rt elbow  . TRIGGER FINGER RELEASE  10/16/2011   Procedure: RELEASE TRIGGER FINGER/A-1 PULLEY;  Surgeon: Tami RibasKevin R Aleiya Rye, MD;  Location: [facility];  Service: Orthopedics;  Laterality: Right;  right small finger  . TRIGGER FINGER RELEASE Right 11/05/2017   Procedure: RELEASE TRIGGER FINGER/A-1 PULLEY RIGHT RING FINGER;  Surgeon: Betha LoaKuzma, Calianna Kim, MD;  Location: [facility];  Service: Orthopedics;  Laterality: Right;    Family History: Family History  Problem Relation Age of Onset  . Anesthesia problems Mother        hard to wake up  . Cancer Mother        breast/thyroid  . Lupus Mother   . Diabetes Mother   . Graves' disease Mother   . Cancer Father        melanoma-3 years ago, new diag of skin cancer 1/15  . Heart disease Father   . Cancer Brother        colon  .  Cancer Maternal Aunt        breast  . Cancer Maternal Grandmother        breast  . Diabetes Daughter   . Melanoma Maternal Uncle     Social History:   reports that she has never smoked. She has never used smokeless tobacco. She reports that she drinks about 2.0 - 3.0 standard drinks of alcohol per week. She reports that she does not use drugs.  Medications: Medications Prior to Admission  Medication Sig Dispense Refill  . cholecalciferol (D-VI-SOL) 400 UNIT/ML LIQD Take 400 Units by mouth daily.    . fluticasone (FLONASE) 50 MCG/ACT nasal spray Place into both nostrils daily.    Marland Kitchen. HYDROcodone-acetaminophen (NORCO/VICODIN) 5-325 MG tablet Take 1 tablet by mouth every 6 (six) hours as needed for moderate pain.    . Melatonin 5 MG CAPS Take by mouth.    . naproxen sodium (ALEVE) 220 MG tablet Take 220 mg by mouth.    . temazepam (RESTORIL) 15 MG capsule Take 1 capsule by mouth daily.    . SUMAtriptan (IMITREX) 20 MG/ACT nasal spray Place 1 spray into the nose.  5    No results found for this or any previous visit (from the past 48 hour(s)).  No results found.   A  comprehensive review of systems was negative.  Height 5\' 4"  (1.626 m), weight 69.9 kg, last menstrual period 09/09/2007.  General appearance: alert, cooperative and appears stated age Head: Normocephalic, without obvious abnormality, atraumatic Neck: supple, symmetrical, trachea midline Cardio: regular rate and rhythm Resp: clear to auscultation bilaterally Extremities: Intact sensation and capillary refill all digits.  +epl/fpl/io.  No wounds.  Pulses: 2+ and symmetric Skin: Skin color, texture, turgor normal. No rashes or lesions Neurologic: Grossly normal Incision/Wound: none  Assessment/Plan Left distal radius fracture.  Non operative and operative treatment options were discussed with the patient and patient wishes to proceed with operative treatment. Risks, benefits, and alternatives of surgery were discussed  and the patient agrees with the plan of care.   [name] R 05/25/2018, 12:39 PM

## 2018-05-25 NOTE — Progress Notes (Signed)
Assisted Dr. Foster with left, ultrasound guided, axillary block. Side rails up, monitors on throughout procedure. See vital signs in flow sheet. Tolerated Procedure well. 

## 2018-05-25 NOTE — Op Note (Signed)
05/25/2018 [facility]  Operative Note  Pre Op Diagnosis: Left extraarticular distal radius fracture  Post Op Diagnosis: Left extraarticular distal radius fracture  Procedure: ORIF Left extraarticular distal radius fracture  Surgeon: Betha LoaKevin Malick Netz, MD  Assistant: Cindee SaltGary Emmet Messer, MD  Anesthesia: Regional with sedation  Fluids: Per anesthesia flow sheet  EBL: minimal  Complications: None  Specimen: None  Tourniquet Time:  Total Tourniquet Time Documented: Upper Arm (Left) - 42 minutes Total: Upper Arm (Left) - 42 minutes   Disposition: Stable to PACU  INDICATIONS:  Vickie Velazquez is a 52 y.o. female states she fell getting out of her boat 2 days ago.  [facility] distal radius fracture.  Splinted and followed up in the office.  We discussed nonoperative and operative treatment options.  She wished to proceed with operative fixation.  Risks, benefits, and alternatives of surgery were discussed including the risk of blood loss; infection; damage to nerves, vessels, tendons, ligaments, bone; failure of surgery; need for additional surgery; complications with wound healing; continued pain; nonunion; malunion; stiffness.  We also discussed the possible need for bone graft and the benefits and risks including the possibility of disease transmission.  She voiced understanding of these risks and elected to proceed.    OPERATIVE COURSE:  After being identified preoperatively by myself, the patient and I agreed upon the procedure and site of procedure.  Surgical site was marked.  The risks, benefits and alternatives of the surgery were reviewed and she wished to proceed.  Surgical consent had been signed.  She was given IV Ancef as preoperative antibiotic prophylaxis.  She was transferred to the operating room and placed on the operating room table in supine position with the Left upper extremity on an armboard. Sedation was induced by the  anesthesiologist.  A regional block had been performed by anesthesia in preoperative holding.  The Left upper extremity was prepped and draped in normal sterile orthopedic fashion.  A surgical pause was performed between the surgeons, anesthesia and operating room staff, and all were in agreement as to the patient, procedure and site of procedure.  Tourniquet at the proximal aspect of the extremity was inflated to 250 mmHg after exsanguination of the limb with an Esmarch bandage.  Standard volar Sherilyn CooterHenry approach was used.  The bipolar electrocautery was used to obtain hemostasis.  The superficial and deep portions of the FCR tendon sheath were incised, and the FCR and FPL were swept ulnarly to protect the palmar cutaneous branch of the median nerve.  The brachioradialis was released at the radial side of the radius.  The pronator quadratus was released and elevated with the periosteal elevator.  The fracture site was identified and cleared of soft tissue interposition and hematoma.  It was reduced under direct visualization.  There did not appear to be intraarticular extension.  An AcuMed volar distal radial locking plate was selected.  It was secured to the bone with the guidepins.  C-arm was used in AP and lateral projections to ensure appropriate reduction and position of the hardware and adjustments made as necessary.  Standard AO drilling and measuring technique was used.  A single screw was placed in the slotted hole in the shaft of the plate.  The distal holes were filled with locking pegs with the exception of the styloid holes, which were filled with locking screws.  The remaining holes in the shaft of the plate were filled with nonlocking screws.  Good  purchase was obtained.  C-arm was used in AP, lateral and oblique projections to ensure appropriate reduction and position of hardware, which was the case.  There was no intra-articular penetration of hardware.  The wound was copiously irrigated with sterile  saline.  Pronator quadratus was repaired back over top of the plate using 4-0 Vicryl suture.  Vicryl suture was placed in the subcutaneous tissues in an inverted interrupted fashion and the skin was closed with 4-0 nylon in a horizontal mattress fashion.  There was good pronation and supination of the wrist without crepitance.  The wound was then dressed with sterile Xeroform, 4x4s, and wrapped with a Kerlix bandage.  A volar splint was placed and wrapped with Kerlix and Ace bandage.  Tourniquet was deflated at 42 minutes.  Fingertips were pink with brisk capillary refill after deflation of the tourniquet.  Operative drapes were broken down.  The patient was awoken from anesthesia safely.  She was transferred back to the stretcher and taken to the PACU in stable condition.  I will see her back in the office in one week for postoperative followup.  I will give her a prescription for Norco 5/325 1-2 tabs PO q6 hours prn pain, dispense # 20.    Tami Ribas, MD Electronically signed, 05/25/18

## 2018-05-25 NOTE — Anesthesia Preprocedure Evaluation (Signed)
Anesthesia Evaluation  Patient identified by MRN, date of birth, ID band Patient awake    Reviewed: Allergy & Precautions, NPO status , Patient's Chart, lab work & pertinent test results  Airway Mallampati: II  TM Distance: >3 FB Neck ROM: Full    Dental no notable dental hx. (+) Teeth Intact   Pulmonary neg pulmonary ROS,    Pulmonary exam normal breath sounds clear to auscultation       Cardiovascular negative cardio ROS Normal cardiovascular exam Rhythm:Regular Rate:Normal     Neuro/Psych  Headaches, negative psych ROS   GI/Hepatic Neg liver ROS, Hx/o Gastric bypass   Endo/Other  negative endocrine ROS  Renal/GU negative Renal ROS  negative genitourinary   Musculoskeletal Left distal radius Fx   Abdominal   Peds  Hematology  (+) anemia , Hx/o Iron malabsorption   Anesthesia Other Findings   Reproductive/Obstetrics                             Anesthesia Physical Anesthesia Plan  ASA: II  Anesthesia Plan: Regional and MAC   Post-op Pain Management:    Induction:   PONV Risk Score and Plan: 2 and Ondansetron, Propofol infusion and Treatment may vary due to age or medical condition  Airway Management Planned: Natural Airway and Simple Face Mask  Additional Equipment:   Intra-op Plan:   Post-operative Plan:   Informed Consent: I have reviewed the patients History and Physical, chart, labs and discussed the procedure including the risks, benefits and alternatives for the proposed anesthesia with the patient or authorized representative who has indicated his/her understanding and acceptance.   Dental advisory given  Plan Discussed with: CRNA and Surgeon  Anesthesia Plan Comments:         Anesthesia Quick Evaluation

## 2018-05-25 NOTE — Anesthesia Postprocedure Evaluation (Signed)
Anesthesia Post Note  Patient: Vickie Velazquez  Procedure(s) Performed: OPEN REDUCTION INTERNAL FIXATION (ORIF) LEFT DISTAL RADIAL FRACTURE (Left Arm Lower)     Patient location during evaluation: PACU Anesthesia Type: Regional and MAC Level of consciousness: awake and alert and oriented Pain management: pain level controlled Vital Signs Assessment: post-procedure vital signs reviewed and stable Respiratory status: spontaneous breathing, nonlabored ventilation and respiratory function stable Cardiovascular status: stable and blood pressure returned to baseline Postop Assessment: no apparent nausea or vomiting Anesthetic complications: no    Last Vitals:  Vitals:   05/25/18 1515 05/25/18 1545  BP: (!) 95/58 103/68  Pulse: 62 61  Resp: 20 18  Temp:  36.7 C  SpO2: 96% 99%    Last Pain:  Vitals:   05/25/18 1545  TempSrc:   PainSc: 0-No pain                 [name]

## 2018-05-26 ENCOUNTER — Encounter (HOSPITAL_BASED_OUTPATIENT_CLINIC_OR_DEPARTMENT_OTHER): Payer: Self-pay | Admitting: Orthopedic Surgery

## 2019-03-21 ENCOUNTER — Other Ambulatory Visit (HOSPITAL_BASED_OUTPATIENT_CLINIC_OR_DEPARTMENT_OTHER): Payer: Self-pay | Admitting: Family Medicine

## 2019-03-21 DIAGNOSIS — Z1231 Encounter for screening mammogram for malignant neoplasm of breast: Secondary | ICD-10-CM

## 2019-03-22 ENCOUNTER — Ambulatory Visit (HOSPITAL_BASED_OUTPATIENT_CLINIC_OR_DEPARTMENT_OTHER)
Admission: RE | Admit: 2019-03-22 | Discharge: 2019-03-22 | Disposition: A | Discharge status: Home / Self Care | Payer: BC Managed Care – PPO | Source: Ambulatory Visit | Attending: Family Medicine | Admitting: Family Medicine

## 2019-03-22 ENCOUNTER — Encounter (HOSPITAL_BASED_OUTPATIENT_CLINIC_OR_DEPARTMENT_OTHER): Payer: Self-pay

## 2019-03-22 ENCOUNTER — Other Ambulatory Visit: Payer: Self-pay

## 2019-03-22 DIAGNOSIS — Z1231 Encounter for screening mammogram for malignant neoplasm of breast: Secondary | ICD-10-CM | POA: Diagnosis present

## 2019-04-01 ENCOUNTER — Other Ambulatory Visit: Payer: Self-pay

## 2019-04-05 ENCOUNTER — Other Ambulatory Visit: Payer: Self-pay

## 2019-04-05 ENCOUNTER — Ambulatory Visit: Payer: BC Managed Care – PPO | Admitting: Obstetrics & Gynecology

## 2019-04-05 ENCOUNTER — Encounter: Payer: Self-pay | Admitting: Obstetrics & Gynecology

## 2019-04-05 VITALS — BP 102/60 | HR 84 | Temp 97.8°F | Ht 63.75 in | Wt 163.0 lb

## 2019-04-05 DIAGNOSIS — R300 Dysuria: Secondary | ICD-10-CM | POA: Diagnosis not present

## 2019-04-05 DIAGNOSIS — Z01419 Encounter for gynecological examination (general) (routine) without abnormal findings: Secondary | ICD-10-CM

## 2019-04-05 DIAGNOSIS — Z0000 Encounter for general adult medical examination without abnormal findings: Secondary | ICD-10-CM | POA: Diagnosis not present

## 2019-04-05 LAB — POCT URINALYSIS DIPSTICK
Bilirubin, UA: NEGATIVE
Glucose, UA: NEGATIVE
Ketones, UA: NEGATIVE
Nitrite, UA: POSITIVE
Protein, UA: POSITIVE — AB
Urobilinogen, UA: 0.2 E.U./dL
pH, UA: 5 (ref 5.0–8.0)

## 2019-04-05 MED ORDER — ESTRADIOL 0.1 MG/GM VA CREA: TOPICAL_CREAM | VAGINAL | 1 refills | Status: DC

## 2019-04-05 MED ORDER — NITROFURANTOIN MONOHYD MACRO 100 MG PO CAPS: 100.0000 mg | ORAL_CAPSULE | Freq: Two times a day (BID) | ORAL | 0 refills | Status: DC

## 2019-04-05 NOTE — Patient Instructions (Addendum)
  [facility] at [facility]: [address] [place], [place], [place] [address] Phone: [phone]  Femdophilus--pribiotic for urinary health

## 2019-04-05 NOTE — Progress Notes (Signed)
53 y.o. G39P4 Married White or Caucasian female here for annual exam.  Doing well.  Had radial fracture requiring surgical repair.    Daughter got married in September at [place] in [place].  She lives in [place].  Husband is getting doctorate at the [employer].    Denies vaginal bleeding.  Has experienced several UTIs this past year.      PCP:  Jillyn Ledger.  Has iron levels done in January.  This was normal.  Had physical 11/30/2018.  Blood work was normal then as well.  Patient's last menstrual period was 09/09/2007.          Sexually active: Yes.    The current method of family planning is post menopausal status.    Exercising: No.   Smoker:  no  Health Maintenance: Pap:  01/06/17 Neg. HR HPV:neg   10/20/14 neg  History of abnormal Pap:  no MMG:  03/22/19 BIRADS1:neg  Colonoscopy:  01/20/14 f/u 5 years.  This was scheduled for last week.  She had to reschedule due to not having a driver.  Has this scheduled in September.   BMD:   Never TDaP:  2012 Pneumonia vaccine(s):  n/a Shingrix:   Reviewed with pt today.   Hep C testing: done  Screening Labs: PCP   reports that she has never smoked. She has never used smokeless tobacco. She reports current alcohol use of about 5.0 standard drinks of alcohol per week. She reports that she does not use drugs.  Past Medical History:  Diagnosis Date  . Closed fracture of left distal radius   . Iron deficiency anemia 01/09/2016  . 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 Migraine    with menses  . Morbid obesity ([place])   . 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 Plantar fasciitis    Patient-reported on 04/24/12  . Seasonal allergies   . Trigger finger of right hand    RRF    Past Surgical History:  Procedure Laterality Date  . GASTRIC ROUX-EN-Y  08/02/2012   Procedure: LAPAROSCOPIC ROUX-EN-Y GASTRIC BYPASS WITH UPPER ENDOSCOPY;  Surgeon: Madilyn Hook, DO;  [facility];  Service: General;  Laterality: N/A;  . HERNIA  REPAIR  08/02/12  . LATERAL COLLATERAL LIGAMENT REPAIR, ELBOW  2008   lateral release-rt elbow  . OPEN REDUCTION INTERNAL FIXATION (ORIF) DISTAL RADIAL FRACTURE Left 05/25/2018   Procedure: OPEN REDUCTION INTERNAL FIXATION (ORIF) LEFT DISTAL RADIAL FRACTURE;  Surgeon: Leanora Cover, MD;  Location: [facility];  Service: Orthopedics;  Laterality: Left;  . TRIGGER FINGER RELEASE  10/16/2011   Procedure: RELEASE TRIGGER FINGER/A-1 PULLEY;  Surgeon: Tennis Must, MD;  Location: [facility];  Service: Orthopedics;  Laterality: Right;  right small finger  . TRIGGER FINGER RELEASE Right 11/05/2017   Procedure: RELEASE TRIGGER FINGER/A-1 PULLEY RIGHT RING FINGER;  Surgeon: Leanora Cover, MD;  Location: [facility];  Service: Orthopedics;  Laterality: Right;    Current Outpatient Medications  Medication Sig Dispense Refill  . cholecalciferol (D-VI-SOL) 400 UNIT/ML LIQD Take 400 Units by mouth daily.    . fluticasone (FLONASE) 50 MCG/ACT nasal spray Place into both nostrils daily.    . Melatonin 5 MG CAPS Take by mouth.    . meloxicam (MOBIC) 7.5 MG tablet Take 7.5 mg by mouth daily.    . SUMAtriptan (IMITREX) 20 MG/ACT nasal spray Place 1 spray into the nose.  5  . topiramate (TOPAMAX) 50 MG tablet Take 50 mg  by mouth daily.     No current facility-administered medications for this visit.     Family History  Problem Relation Age of Onset  . Anesthesia problems Mother        hard to wake up  . Cancer Mother        breast/thyroid  . Lupus Mother   . Diabetes Mother   . Graves' disease Mother   . Cancer Father        melanoma-3 years ago, new diag of skin cancer 1/15  . Heart disease Father   . Cancer Brother        colon  . Cancer Maternal Aunt        breast  . Cancer Maternal Grandmother        breast  . Diabetes Daughter   . Melanoma Maternal Uncle     Review of Systems  Genitourinary: Positive for dysuria and flank pain.  All other systems  reviewed and are negative.   Exam:   BP 102/60   Pulse 84   Temp 97.8 F (36.6 C) (Temporal)   Ht 5' 3.75" (1.619 m)   Wt 163 lb (73.9 kg)   LMP 09/09/2007   BMI 28.20 kg/m  Weight: +9#  Height: 5' 3.75" (161.9 cm)  Ht Readings from Last 3 Encounters:  04/05/19 5' 3.75" (1.619 m)  05/25/18 5\' 4"  (1.626 m)  01/11/18 5' 4.25" (1.632 m)    General appearance: alert, cooperative and appears stated age Head: Normocephalic, without obvious abnormality, atraumatic Neck: no adenopathy, supple, symmetrical, trachea midline and thyroid normal to inspection and palpation Lungs: clear to auscultation bilaterally Breasts: normal appearance, no masses or tenderness Heart: regular rate and rhythm Abdomen: soft, non-tender; bowel sounds normal; no masses,  no organomegaly Extremities: extremities normal, atraumatic, no cyanosis or edema Skin: Skin color, texture, turgor normal. No rashes or lesions Lymph nodes: Cervical, supraclavicular, and axillary nodes normal. No abnormal inguinal nodes palpated Neurologic: Grossly normal   Pelvic: External genitalia:  no lesions              Urethra:  normal appearing urethra with no masses, tenderness or lesions              Bartholins and Skenes: normal                 Vagina: normal appearing vagina with normal color and discharge, no lesions              Cervix: no lesions              Pap taken: No. Bimanual Exam:  Uterus:  normal size, contour, position, consistency, mobility, non-tender              Adnexa: normal adnexa and no mass, fullness, tenderness               Rectovaginal: Confirms               Anus:  normal sphincter tone, no lesions  Chaperone was present for exam.  A:  Well Woman with normal exam PMP, no HRT H/o bariatric surgery, 11/13 Recurrent UTIs  P:   Mammogram guidelines reviewed pap smear with neg HR HPV 2018.  Not indicated this year. Strategies for dealing with recurrent UTIs--probiotic, vaginal estrogen, water  intake and prophylactic antibiotics Shingrix vaccination discussed Colonoscopy is due this year.  She has appt for this already. Lab work UTD with Peri MarisAndrew Brake, FNP Return annually or prn

## 2019-04-06 LAB — URINALYSIS, MICROSCOPIC ONLY: Casts: NONE SEEN /LPF

## 2019-04-08 LAB — URINE CULTURE

## 2019-05-23 ENCOUNTER — Telehealth: Payer: Self-pay | Admitting: Obstetrics & Gynecology

## 2019-05-23 NOTE — Telephone Encounter (Signed)
Patient is calling regarding reoccurring UTI.

## 2019-05-23 NOTE — Telephone Encounter (Signed)
Call to patient. Patient states she thinks she has a UTI. States it "may have been lingering for a while." Complaining of dark yellow and cloudy urine with a "strong scent" to it. Patient states she is having some slight back and abdominal pain. Denies fever or chills. RN offered patient appointment this afternoon, but patient declined stating she has an interview in [place] at 1430 she can't miss. Patient states, "I'm okay to be seen tomorrow." OV scheduled for 05-24-2019 at 1000. Patient agreeable to date and time of appointment. Pain/fever precautions reviewed with patient and she verbalized understanding. Covid prescreening negative.   Routing to provider and will close encounter.

## 2019-05-24 ENCOUNTER — Ambulatory Visit: Payer: BC Managed Care – PPO | Admitting: Obstetrics & Gynecology

## 2019-05-24 ENCOUNTER — Encounter: Payer: Self-pay | Admitting: Obstetrics & Gynecology

## 2019-05-24 ENCOUNTER — Other Ambulatory Visit: Payer: Self-pay

## 2019-05-24 VITALS — BP 122/70 | HR 72 | Temp 97.3°F | Ht 63.75 in | Wt 164.8 lb

## 2019-05-24 DIAGNOSIS — R8290 Unspecified abnormal findings in urine: Secondary | ICD-10-CM

## 2019-05-24 DIAGNOSIS — N3000 Acute cystitis without hematuria: Secondary | ICD-10-CM | POA: Diagnosis not present

## 2019-05-24 LAB — POCT URINALYSIS DIPSTICK
Bilirubin, UA: NEGATIVE
Blood, UA: NEGATIVE
Glucose, UA: NEGATIVE
Ketones, UA: NEGATIVE
Nitrite, UA: POSITIVE
Protein, UA: POSITIVE — AB
Urobilinogen, UA: 0.2 E.U./dL
pH, UA: 5 (ref 5.0–8.0)

## 2019-05-24 MED ORDER — NITROFURANTOIN MONOHYD MACRO 100 MG PO CAPS: 100.0000 mg | ORAL_CAPSULE | Freq: Two times a day (BID) | ORAL | 0 refills | Status: DC

## 2019-05-24 NOTE — Progress Notes (Signed)
GYNECOLOGY  VISIT  CC:   Odor, pelvic pain, lower back pain x 1 week  HPI: 53 y.o. G63P4 Married White or Caucasian female here for possible UTI.  This is the third or fourth UTI this year.  Denies blood in urine.  Denies fever.  She is having some low back pain and low pelvic pain.  Denies vaginal discharge or vaginal bleeding.   GYNECOLOGIC HISTORY: Patient's last menstrual period was 09/09/2007. Contraception: PMP Menopausal hormone therapy: estrace vag cream   Patient Active Problem List   Diagnosis Date Noted  . Iron deficiency anemia 01/09/2016  . 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 Closed fracture of left distal radius   . Iron deficiency anemia 01/09/2016  . 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 Migraine    with menses  . Morbid obesity ([facility] [place])   . 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 Plantar fasciitis    Patient-reported on 04/24/12  . Seasonal allergies   . Trigger finger of right hand    RRF    Past Surgical History:  Procedure Laterality Date  . GASTRIC ROUX-EN-Y  08/02/2012   Procedure: LAPAROSCOPIC ROUX-EN-Y GASTRIC BYPASS WITH UPPER ENDOSCOPY;  Surgeon: Madilyn Hook, DO;  [facility];  Service: General;  Laterality: N/A;  . HERNIA REPAIR  08/02/12  . LATERAL COLLATERAL LIGAMENT REPAIR, ELBOW  2008   lateral release-rt elbow  . OPEN REDUCTION INTERNAL FIXATION (ORIF) DISTAL RADIAL FRACTURE Left 05/25/2018   Procedure: OPEN REDUCTION INTERNAL FIXATION (ORIF) LEFT DISTAL RADIAL FRACTURE;  Surgeon: Leanora Cover, MD;  Location: [facility];  Service: Orthopedics;  Laterality: Left;  . TRIGGER FINGER RELEASE  10/16/2011   Procedure: RELEASE TRIGGER FINGER/A-1 PULLEY;  Surgeon: Tennis Must, MD;  Location: [facility];  Service: Orthopedics;  Laterality: Right;  right small finger  . TRIGGER FINGER RELEASE Right 11/05/2017   Procedure: RELEASE  TRIGGER FINGER/A-1 PULLEY RIGHT RING FINGER;  Surgeon: Leanora Cover, MD;  Location: [facility];  Service: Orthopedics;  Laterality: Right;    MEDS:   Current Outpatient Medications on File Prior to Visit  Medication Sig Dispense Refill  . cholecalciferol (D-VI-SOL) 400 UNIT/ML LIQD Take 400 Units by mouth daily.    Marland Kitchen estradiol (ESTRACE) 0.1 MG/GM vaginal cream 1 gram vaginally twice weekly 42.5 g 1  . fluticasone (FLONASE) 50 MCG/ACT nasal spray Place into both nostrils daily.    . Melatonin 5 MG CAPS Take by mouth.    . meloxicam (MOBIC) 7.5 MG tablet Take 7.5 mg by mouth daily.    . SUMAtriptan (IMITREX) 20 MG/ACT nasal spray Place 1 spray into the nose.  5  . topiramate (TOPAMAX) 50 MG tablet Take 50 mg by mouth daily.     No current facility-administered medications on file prior to visit.     ALLERGIES: Sulfa antibiotics  Family History  Problem Relation Age of Onset  . Anesthesia problems Mother        hard to wake up  . Cancer Mother        breast/thyroid  . Lupus Mother   . Diabetes Mother   . Graves' disease Mother   . Cancer Father        melanoma-3 years ago, new diag of skin cancer 1/15  . Heart disease Father   . Cancer Brother        colon  . Cancer Maternal  Aunt        breast  . Cancer Maternal Grandmother        breast  . Diabetes Daughter   . Melanoma Maternal Uncle     SH:  Married, non smoker  Review of Systems  Genitourinary: Positive for pelvic pain.       Odor   Musculoskeletal: Positive for back pain.  All other systems reviewed and are negative.   PHYSICAL EXAMINATION:    BP 122/70   Pulse 72   Temp (!) 97.3 F (36.3 C) (Temporal)   Ht 5' 3.75" (1.619 m)   Wt 164 lb 12.8 oz (74.8 kg)   LMP 09/09/2007   BMI 28.51 kg/m     General appearance: alert, cooperative and appears stated age Flank:  No CVA tenderness Abdomen: soft, non-tender; bowel sounds normal; no masses,  no organomegaly Lymph:  no inguinal LAD  noted  Pelvic: External genitalia:  no lesions              Urethra:  normal appearing urethra with no masses, tenderness or lesions              Bartholins and Skenes: normal                 Vagina: normal appearing vagina with normal color and discharge, no lesions              Cervix: no lesions              Bimanual Exam:  Uterus:  normal size, contour, position, consistency, mobility, non-tender              Adnexa: no mass, fullness, tenderness  Chaperone was present for exam.  Assessment: Cystitis Recurrent UTi  Plan: Urine micro and culture pending.   Macrobid 100mg  bid x 5 days Pt will return for repeat culture two weeks after fininshing antibiotics.  Treatment for recurrent UTI discussed.  If repeat culture is negative, plan suppressive therapy with trimethoprim.

## 2019-05-25 LAB — URINALYSIS, MICROSCOPIC ONLY
Casts: NONE SEEN /lpf
WBC, UA: >30 /hpf — AB (ref 0–5)

## 2019-05-26 LAB — URINE CULTURE

## 2019-05-27 ENCOUNTER — Other Ambulatory Visit: Payer: Self-pay | Admitting: *Deleted

## 2019-05-27 DIAGNOSIS — Z87440 Personal history of urinary (tract) infections: Secondary | ICD-10-CM

## 2019-05-27 MED ORDER — TRIMETHOPRIM 100 MG PO TABS: 100.0000 mg | ORAL_TABLET | Freq: Every day | ORAL | 1 refills | Status: DC

## 2019-05-30 ENCOUNTER — Encounter: Payer: Self-pay | Admitting: Obstetrics & Gynecology

## 2019-05-30 ENCOUNTER — Telehealth: Payer: Self-pay | Admitting: Obstetrics & Gynecology

## 2019-05-30 NOTE — Telephone Encounter (Signed)
Patient seen in office on 05/24/19, tx for recurrent UTI discussed. Tx for e coli UTI. Scheduled for repeat UA/Micro  on 06/14/19.   Routing to Dr. Sabra Heck to review and advise on alternative RX, if appropriate.

## 2019-05-30 NOTE — Telephone Encounter (Signed)
Patient sent the following correspondence through [facility].  Question about the long term prescription... trimethoprim 100 mg 1 tablet daily... Warnings say to avoid sunlight. 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 Should we look at something else? Sorry to be a pain but don't want to be having problems later on.  Thank you! Valetta Fuller

## 2019-06-03 MED ORDER — NITROFURANTOIN MONOHYD MACRO 100 MG PO CAPS: 100.0000 mg | ORAL_CAPSULE | Freq: Every day | ORAL | 1 refills | Status: DC

## 2019-06-03 NOTE — Telephone Encounter (Signed)
I think it is ok to use macrobid 100mg  daily.  Ok to send to pharmacy.  #90/1RF.

## 2019-06-03 NOTE — Telephone Encounter (Signed)
Patient following up to mychart message from 9/21. States she does not know whether to start the medication or not because [place] in the [place] this [place] and does not want to have a reaction. Please advise.

## 2019-06-03 NOTE — Telephone Encounter (Signed)
Spoke with patient, advised as seen below per Dr. Sabra Heck. Rx for Macrobid to verified pharmacy. Patient verbalizes understanding and is agreeable.   Encounter closed.

## 2019-06-14 ENCOUNTER — Ambulatory Visit: Payer: BC Managed Care – PPO

## 2019-06-14 ENCOUNTER — Other Ambulatory Visit: Payer: Self-pay

## 2019-06-14 DIAGNOSIS — Z87440 Personal history of urinary (tract) infections: Secondary | ICD-10-CM

## 2019-06-14 NOTE — Progress Notes (Signed)
Pt is here for Vickie Velazquez for UTI. She completed the antibiotic and is taking the daily preventative antibiotic. She is not having any abnormal urinary symptoms at this time. Urine sent for culture and micro.

## 2019-06-15 LAB — URINALYSIS, MICROSCOPIC ONLY
Casts: NONE SEEN /lpf
RBC: NONE SEEN /hpf (ref 0–2)

## 2019-06-16 LAB — URINE CULTURE

## 2019-08-23 ENCOUNTER — Other Ambulatory Visit: Payer: Self-pay

## 2019-08-23 ENCOUNTER — Ambulatory Visit: Payer: BLUE CROSS/BLUE SHIELD | Attending: Internal Medicine

## 2019-08-23 DIAGNOSIS — Z20822 Contact with and (suspected) exposure to covid-19: Secondary | ICD-10-CM

## 2019-08-23 DIAGNOSIS — Z20828 Contact with and (suspected) exposure to other viral communicable diseases: Secondary | ICD-10-CM | POA: Insufficient documentation

## 2019-08-24 LAB — NOVEL CORONAVIRUS, NAA: SARS-CoV-2, NAA: NOT DETECTED

## 2019-08-25 NOTE — Progress Notes (Signed)
Order(s) created erroneously. Erroneous order ID: [id number]  Order moved by: Brylan Seubert M  Order move date/time: 08/25/2019 10:31 AM  Source Patient: [id number]  Source Contact: 08/23/2019  Destination Patient: [id number]  Destination Contact: 08/23/2019 

## 2019-08-25 NOTE — Progress Notes (Signed)
Moving orders to this encounter.  

## 2019-08-25 NOTE — Progress Notes (Signed)
Order(s) created erroneously. Erroneous order ID: [id number]  Order moved by: Brigitte Pulse  Order move date/time: 08/25/2019 10:31 AM  Source Patient: [id number]  Source Contact: 08/23/2019  Destination Patient: [id number]  Destination Contact: 08/23/2019

## 2019-09-23 ENCOUNTER — Other Ambulatory Visit: Payer: BLUE CROSS/BLUE SHIELD

## 2019-09-26 ENCOUNTER — Ambulatory Visit: Payer: BLUE CROSS/BLUE SHIELD | Attending: Internal Medicine

## 2019-09-26 DIAGNOSIS — Z20822 Contact with and (suspected) exposure to covid-19: Secondary | ICD-10-CM

## 2019-09-28 LAB — NOVEL CORONAVIRUS, NAA

## 2019-09-29 ENCOUNTER — Ambulatory Visit: Payer: BLUE CROSS/BLUE SHIELD | Attending: Internal Medicine

## 2019-09-29 ENCOUNTER — Other Ambulatory Visit: Payer: Self-pay

## 2019-09-29 DIAGNOSIS — Z20822 Contact with and (suspected) exposure to covid-19: Secondary | ICD-10-CM

## 2019-09-30 LAB — NOVEL CORONAVIRUS, NAA: SARS-CoV-2, NAA: NOT DETECTED

## 2019-12-01 ENCOUNTER — Ambulatory Visit: Payer: BLUE CROSS/BLUE SHIELD | Attending: Internal Medicine

## 2019-12-01 ENCOUNTER — Other Ambulatory Visit: Payer: Self-pay | Admitting: Obstetrics & Gynecology

## 2019-12-01 DIAGNOSIS — Z23 Encounter for immunization: Secondary | ICD-10-CM

## 2019-12-01 NOTE — Progress Notes (Signed)
   Covid-19 Vaccination Clinic  Name:  Vickie Velazquez    MRN: [id number] DOB: [birth year]  12/01/2019  [name] was observed post Covid-19 immunization for 15 minutes without incident. She was provided with Vaccine Information Sheet and instruction to access the V-Safe system.   [name] was instructed to call 911 with any severe reactions post vaccine: Marland Kitchen Difficulty breathing  . Swelling of face and throat  . A fast heartbeat  . A bad rash all over body  . Dizziness and weakness   Immunizations Administered    Name Date Dose VIS Date Route   Pfizer COVID-19 Vaccine 12/01/2019  9:02 AM 0.3 mL 08/19/2019 Intramuscular   Manufacturer: [employer], [employer]   Lot: [id number]   NDC: [address]-7

## 2019-12-02 NOTE — Telephone Encounter (Signed)
Medication refill request: Macrobid 100 mg daily Last AEX:  04/05/2019 Next AEX: 06/15/2020 Last MMG (if hormonal medication request): N/A Refill authorized: Macrobid 100 mg daily #90 2RF sent in for patient to get to her next aex. Patient is on this for suppression therapy related to UTIs.  Routing to Dr.Miller for review.

## 2019-12-28 ENCOUNTER — Ambulatory Visit: Payer: 59 | Attending: Internal Medicine

## 2019-12-28 DIAGNOSIS — Z23 Encounter for immunization: Secondary | ICD-10-CM

## 2019-12-28 NOTE — Progress Notes (Signed)
   Covid-19 Vaccination Clinic  Name:  Vickie Velazquez    MRN: [id number] DOB: 1965-10-23  12/28/2019  Vickie Velazquez was observed post Covid-19 immunization for 15 minutes without incident. She was provided with Vaccine Information Sheet and instruction to access the V-Safe system.   Vickie Velazquez was instructed to call 911 with any severe reactions post vaccine: Marland Kitchen Difficulty breathing  . Swelling of face and throat  . A fast heartbeat  . A bad rash all over body  . Dizziness and weakness   Immunizations Administered    Name Date Dose VIS Date Route   Pfizer COVID-19 Vaccine 12/28/2019  8:23 AM 0.3 mL 11/02/2018 Intramuscular   Manufacturer: [employer], [employer]   Lot: [id number]   NDC: [address]-7

## 2020-04-23 ENCOUNTER — Other Ambulatory Visit (HOSPITAL_BASED_OUTPATIENT_CLINIC_OR_DEPARTMENT_OTHER): Payer: Self-pay

## 2020-04-23 DIAGNOSIS — Z1231 Encounter for screening mammogram for malignant neoplasm of breast: Secondary | ICD-10-CM

## 2020-04-24 ENCOUNTER — Other Ambulatory Visit (HOSPITAL_BASED_OUTPATIENT_CLINIC_OR_DEPARTMENT_OTHER): Payer: Self-pay | Admitting: Family Medicine

## 2020-04-24 DIAGNOSIS — Z1231 Encounter for screening mammogram for malignant neoplasm of breast: Secondary | ICD-10-CM

## 2020-05-01 ENCOUNTER — Ambulatory Visit (HOSPITAL_BASED_OUTPATIENT_CLINIC_OR_DEPARTMENT_OTHER)
Admission: RE | Admit: 2020-05-01 | Discharge: 2020-05-01 | Disposition: A | Discharge status: Home / Self Care | Payer: 59 | Source: Ambulatory Visit | Attending: Family Medicine | Admitting: Family Medicine

## 2020-05-01 ENCOUNTER — Other Ambulatory Visit: Payer: Self-pay

## 2020-05-01 DIAGNOSIS — Z1231 Encounter for screening mammogram for malignant neoplasm of breast: Secondary | ICD-10-CM | POA: Insufficient documentation

## 2020-05-17 NOTE — Progress Notes (Signed)
54 y.o. G66P4 Married White or Caucasian female here for annual exam.  Doing well.  Had only one UTI in September.  Is on suppressive therapy.   She moved to [place] in [place], [place].  Is in between jobs right now.  Husband travels for work.    Does not want to change to a new provider.    Denies vaginal bleeding.    Patient's last menstrual period was 09/09/2007.          Sexually active: Yes.    The current method of family planning is post menopausal status.    Exercising: No.  exercise Smoker:  no  Health Maintenance: Pap:  01-06-17 neg HPV HR neg History of abnormal Pap:  no MMG:  05-01-2020 category density birads 1:neg Colonoscopy:  01-20-14 f/u 72yrs.  It has been scheduled but rescheduled for a variety of reasons.  She is aware this is due.   BMD:   none TDaP:  2012 Pneumonia vaccine(s):  no Shingrix:   no Hep C testing: done Screening Labs: had blood work with PA at [facility].  Jessica Priest, PA.   reports that she has never smoked. She has never used smokeless tobacco. She reports current alcohol use of about 5.0 standard drinks of alcohol per week. She reports that she does not use drugs.  Past Medical History:  Diagnosis Date  . Closed fracture of left distal radius   . Iron deficiency anemia 01/09/2016  . 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 Migraine    with menses  . Morbid obesity ([facility])   . 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 Plantar fasciitis    Patient-reported on 04/24/12  . Seasonal allergies   . Trigger finger of right hand    RRF    Past Surgical History:  Procedure Laterality Date  . GASTRIC ROUX-EN-Y  08/02/2012   Procedure: LAPAROSCOPIC ROUX-EN-Y GASTRIC BYPASS WITH UPPER ENDOSCOPY;  Surgeon: [name], DO;  [facility];  Service: General;  Laterality: N/A;  . HERNIA REPAIR  08/02/12  . LATERAL COLLATERAL LIGAMENT REPAIR, ELBOW  2008   lateral release-rt elbow  . OPEN REDUCTION INTERNAL FIXATION (ORIF) DISTAL RADIAL FRACTURE Left 05/25/2018    Procedure: OPEN REDUCTION INTERNAL FIXATION (ORIF) LEFT DISTAL RADIAL FRACTURE;  Surgeon: Betha Loa, MD;  Location: [facility];  Service: Orthopedics;  Laterality: Left;  . TRIGGER FINGER RELEASE  10/16/2011   Procedure: RELEASE TRIGGER FINGER/A-1 PULLEY;  Surgeon: Tami Ribas, MD;  Location: [facility];  Service: Orthopedics;  Laterality: Right;  right small finger  . TRIGGER FINGER RELEASE Right 11/05/2017   Procedure: RELEASE TRIGGER FINGER/A-1 PULLEY RIGHT RING FINGER;  Surgeon: Betha Loa, MD;  Location: [facility];  Service: Orthopedics;  Laterality: Right;    Current Outpatient Medications  Medication Sig Dispense Refill  . estradiol (ESTRACE) 0.1 MG/GM vaginal cream 1 gram vaginally twice weekly 42.5 g 1  . fluticasone (FLONASE) 50 MCG/ACT nasal spray Place into both nostrils daily.    Marland Kitchen MELATONIN PO Take 50 mg by mouth.    . nitrofurantoin, macrocrystal-monohydrate, (MACROBID) 100 MG capsule Take 100 mg by mouth daily.    Marland Kitchen topiramate (TOPAMAX) 50 MG tablet Take 50 mg by mouth daily.     No current facility-administered medications for this visit.    Family History  Problem Relation Age of Onset  . Anesthesia problems Mother        hard to wake up  . Cancer Mother  breast/thyroid  . Lupus Mother   . Diabetes Mother   . Graves' disease Mother   . Cancer Father        melanoma-3 years ago, new diag of skin cancer 1/15  . Heart disease Father   . Cancer Brother        colon  . Cancer Maternal Aunt        breast  . Cancer Maternal Grandmother        breast  . Diabetes Daughter   . Melanoma Maternal Uncle     Review of Systems  Constitutional: Negative.   HENT: Negative.   Eyes: Negative.   Respiratory: Negative.   Cardiovascular: Negative.   Gastrointestinal: Negative.   Endocrine: Negative.   Genitourinary: Negative.   Musculoskeletal: Negative.   Skin: Negative.   Allergic/Immunologic: Negative.    Neurological: Negative.   Hematological: Negative.   Psychiatric/Behavioral: Negative.     Exam:   BP 118/80   Pulse 70   Resp 16   Ht 5' 3.75" (1.619 m)   Wt 160 lb (72.6 kg)   LMP 09/09/2007   BMI 27.68 kg/m   Height: 5' 3.75" (161.9 cm)  General appearance: alert, cooperative and appears stated age Head: Normocephalic, without obvious abnormality, atraumatic Neck: no adenopathy, supple, symmetrical, trachea midline and thyroid normal to inspection and palpation Lungs: clear to auscultation bilaterally Breasts: normal appearance, no masses or tenderness Heart: regular rate and rhythm Abdomen: soft, non-tender; bowel sounds normal; no masses,  no organomegaly Extremities: extremities normal, atraumatic, no cyanosis or edema Skin: Skin color, texture, turgor normal. No rashes or lesions Lymph nodes: Cervical, supraclavicular, and axillary nodes normal. No abnormal inguinal nodes palpated Neurologic: Grossly normal   Pelvic: External genitalia:  no lesions              Urethra:  normal appearing urethra with no masses, tenderness or lesions              Bartholins and Skenes: normal                 Vagina: normal appearing vagina with normal color and discharge, no lesions              Cervix: no lesions              Pap taken: Yes.   Bimanual Exam:  Uterus:  normal size, contour, position, consistency, mobility, non-tender              Adnexa: normal adnexa and no mass, fullness, tenderness               Rectovaginal: Confirms               Anus:  normal sphincter tone, no lesions  Chaperone, Zenovia Jordan, CMA, was present for exam.  A:  Well Woman with normal exam PMP, no HRT H/o bariatric surgery, 11/13 Recurrent UTIs  P:   Mammogram guidelines reviewed pap smear with HR HPV obtained today Will continue suppressive therapy for UTI.  May considering taking this every other day.  macrobid 100mg  daily #90/4RF RF for estradiol cream 1gm pv twice daily.   42.5gram/75F Tdap due 2022 Consider BMD around age 65 Return annually or prn

## 2020-05-21 ENCOUNTER — Encounter: Payer: Self-pay | Admitting: Obstetrics & Gynecology

## 2020-05-21 ENCOUNTER — Other Ambulatory Visit: Payer: Self-pay

## 2020-05-21 ENCOUNTER — Ambulatory Visit: Payer: 59 | Admitting: Obstetrics & Gynecology

## 2020-05-21 ENCOUNTER — Other Ambulatory Visit (HOSPITAL_COMMUNITY)
Admission: RE | Admit: 2020-05-21 | Discharge: 2020-05-21 | Disposition: A | Discharge status: Home / Self Care | Payer: 59 | Source: Ambulatory Visit | Attending: Obstetrics & Gynecology | Admitting: Obstetrics & Gynecology

## 2020-05-21 VITALS — BP 118/80 | HR 70 | Resp 16 | Ht 63.75 in | Wt 160.0 lb

## 2020-05-21 DIAGNOSIS — Z124 Encounter for screening for malignant neoplasm of cervix: Secondary | ICD-10-CM | POA: Insufficient documentation

## 2020-05-21 DIAGNOSIS — Z01419 Encounter for gynecological examination (general) (routine) without abnormal findings: Secondary | ICD-10-CM | POA: Diagnosis not present

## 2020-05-21 MED ORDER — NITROFURANTOIN MONOHYD MACRO 100 MG PO CAPS
100.0000 mg | ORAL_CAPSULE | Freq: Every day | ORAL | 4 refills | Status: DC
Start: 2020-05-21 — End: 2021-10-07

## 2020-05-21 MED ORDER — ESTRADIOL 0.1 MG/GM VA CREA
TOPICAL_CREAM | VAGINAL | 3 refills | Status: DC
Start: 2020-05-21 — End: 2021-10-07

## 2020-05-22 LAB — CYTOLOGY - PAP
Comment: NEGATIVE
Diagnosis: NEGATIVE
High risk HPV: NEGATIVE

## 2020-06-15 ENCOUNTER — Ambulatory Visit: Payer: BC Managed Care – PPO | Admitting: Obstetrics & Gynecology

## 2021-05-14 ENCOUNTER — Encounter: Payer: Self-pay | Admitting: Hematology & Oncology

## 2021-05-27 ENCOUNTER — Ambulatory Visit (HOSPITAL_BASED_OUTPATIENT_CLINIC_OR_DEPARTMENT_OTHER): Payer: 59 | Admitting: Obstetrics & Gynecology

## 2021-08-15 ENCOUNTER — Ambulatory Visit (HOSPITAL_BASED_OUTPATIENT_CLINIC_OR_DEPARTMENT_OTHER): Payer: BLUE CROSS/BLUE SHIELD | Admitting: Obstetrics & Gynecology

## 2021-10-07 ENCOUNTER — Encounter (HOSPITAL_BASED_OUTPATIENT_CLINIC_OR_DEPARTMENT_OTHER): Payer: Self-pay | Admitting: Obstetrics & Gynecology

## 2021-10-07 ENCOUNTER — Ambulatory Visit (INDEPENDENT_AMBULATORY_CARE_PROVIDER_SITE_OTHER): Payer: Managed Care, Other (non HMO) | Admitting: Obstetrics & Gynecology

## 2021-10-07 ENCOUNTER — Other Ambulatory Visit: Payer: Self-pay

## 2021-10-07 ENCOUNTER — Encounter: Payer: Self-pay | Admitting: Hematology & Oncology

## 2021-10-07 VITALS — BP 150/78 | HR 60 | Ht 64.0 in | Wt 188.4 lb

## 2021-10-07 DIAGNOSIS — Z0000 Encounter for general adult medical examination without abnormal findings: Secondary | ICD-10-CM

## 2021-10-07 DIAGNOSIS — Z9884 Bariatric surgery status: Secondary | ICD-10-CM

## 2021-10-07 DIAGNOSIS — Z8481 Family history of carrier of genetic disease: Secondary | ICD-10-CM | POA: Diagnosis not present

## 2021-10-07 DIAGNOSIS — Z23 Encounter for immunization: Secondary | ICD-10-CM

## 2021-10-07 DIAGNOSIS — Z803 Family history of malignant neoplasm of breast: Secondary | ICD-10-CM

## 2021-10-07 DIAGNOSIS — Z01419 Encounter for gynecological examination (general) (routine) without abnormal findings: Secondary | ICD-10-CM

## 2021-10-07 DIAGNOSIS — N390 Urinary tract infection, site not specified: Secondary | ICD-10-CM

## 2021-10-07 DIAGNOSIS — D508 Other iron deficiency anemias: Secondary | ICD-10-CM | POA: Diagnosis not present

## 2021-10-07 MED ORDER — NITROFURANTOIN MONOHYD MACRO 100 MG PO CAPS: 100.0000 mg | ORAL_CAPSULE | Freq: Every day | ORAL | 4 refills | Status: AC

## 2021-10-07 NOTE — Patient Instructions (Signed)
Shingles vaccination  Tdap is due per my records  Will be back in touch about genetic testing  Consider seeing a bariatric provider about injectable weight loss medications:  wegovy, Saxenda, Ozempic  Ask for neurologist with movement disorder expertise

## 2021-10-07 NOTE — Progress Notes (Signed)
56 y.o. G31P4 Married White or Caucasian female here for annual exam.  Denies vaginal bleeding.    Reports mother had genetic testing and she sent results to me today.  Was +FLCN gene mutation.  This has lung disease, colon cancers and renal cancers as risks.  Pt has questions about testing.  Has strong family hx of breast disease.  Tyrer Cusick lifetime risk for breast cancer is 14.4%.  Family hx reviewed.    Patient's last menstrual period was 09/09/2007.          Sexually active: Yes.    The current method of family planning is post menopausal status.    Exercising: No.  Smoker:  no  Health Maintenance: Pap:  05/21/2020 Negative, neg HR HPV History of abnormal Pap:  no MMG:  05/01/2020 Negative Colonoscopy:  01/20/2014, follow up 5 years.  Pt will have this done closer to home this year. BMD:   plan around age 2 Screening Labs: today   reports that she has never smoked. She has never used smokeless tobacco. She reports current alcohol use of about 5.0 standard drinks per week. She reports that she does not use drugs.  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   Plantar fasciitis    Patient-reported on 04/24/12   Seasonal allergies    Trigger finger of right hand    RRF    Past Surgical History:  Procedure Laterality Date   GASTRIC ROUX-EN-Y  08/02/2012   Procedure: LAPAROSCOPIC ROUX-EN-Y GASTRIC BYPASS WITH UPPER ENDOSCOPY;  Surgeon: Madilyn Hook, DO;  Location: [facility];  Service: General;  Laterality: N/A;   HERNIA REPAIR  08/02/12   LATERAL COLLATERAL LIGAMENT REPAIR, ELBOW  2008   lateral release-rt elbow   OPEN REDUCTION INTERNAL FIXATION (ORIF) DISTAL RADIAL FRACTURE Left 05/25/2018   Procedure: OPEN REDUCTION INTERNAL FIXATION (ORIF) LEFT DISTAL RADIAL FRACTURE;  Surgeon:  Leanora Cover, MD;  Location: [place];  Service: Orthopedics;  Laterality: Left;   TRIGGER FINGER RELEASE  10/16/2011   Procedure: RELEASE TRIGGER FINGER/A-1 PULLEY;  Surgeon: Tennis Must, MD;  Location: [place];  Service: Orthopedics;  Laterality: Right;  right small finger   TRIGGER FINGER RELEASE Right 11/05/2017   Procedure: RELEASE TRIGGER FINGER/A-1 PULLEY RIGHT RING FINGER;  Surgeon: Leanora Cover, MD;  Location: [place];  Service: Orthopedics;  Laterality: Right;    Current Outpatient Medications  Medication Sig Dispense Refill   MELATONIN PO Take 50 mg by mouth.     fluticasone (FLONASE) 50 MCG/ACT nasal spray Place into both nostrils daily. (Patient not taking: Reported on 10/07/2021)     nitrofurantoin, macrocrystal-monohydrate, (MACROBID) 100 MG capsule Take 1 capsule (100 mg total) by mouth daily. 90 capsule 4   topiramate (TOPAMAX) 50 MG tablet Take 50 mg by mouth daily. (Patient not taking: Reported on 10/07/2021)     No current facility-administered medications for this visit.    Family History  Problem Relation Age of Onset   Anesthesia problems Mother        hard to wake up   Cancer Mother        breast/thyroid   Lupus Mother    Diabetes Mother    Berenice Primas' disease Mother    Cancer  Father        melanoma-3 years ago, new diag of skin cancer 1/15   Heart disease Father    Bladder Cancer Father 90   Cancer Brother        colon   Bladder Cancer Brother 39   Cancer Maternal Grandmother        breast   Diabetes Daughter    Cancer Maternal Aunt        breast   Melanoma Maternal Uncle     Review of Systems  All other systems reviewed and are negative.  Exam:   BP (!) 150/78 (BP Location: Right Arm, Patient Position: Sitting, Cuff Size: Large)    Pulse 60    Ht 5\' 4"  (1.626 m) Comment: reported   Wt 188 lb 6.4 oz (85.5 kg)    LMP 09/09/2007    BMI 32.34 kg/m   Height: 5\' 4"  (162.6 cm) (reported)  General  appearance: alert, cooperative and appears stated age Head: Normocephalic, without obvious abnormality, atraumatic Neck: no adenopathy, supple, symmetrical, trachea midline and thyroid normal to inspection and palpation Lungs: clear to auscultation bilaterally Breasts: normal appearance, no masses or tenderness Heart: regular rate and rhythm Abdomen: soft, non-tender; bowel sounds normal; no masses,  no organomegaly Extremities: extremities normal, atraumatic, no cyanosis or edema Skin: Skin color, texture, turgor normal. No rashes or lesions Lymph nodes: Cervical, supraclavicular, and axillary nodes normal. No abnormal inguinal nodes palpated Neurologic: Grossly normal   Pelvic: External genitalia:  no lesions              Urethra:  normal appearing urethra with no masses, tenderness or lesions              Bartholins and Skenes: normal                 Vagina: normal appearing vagina with normal color and no discharge, no lesions              Cervix: no lesions              Pap taken: No. Bimanual Exam:  Uterus:  normal size, contour, position, consistency, mobility, non-tender              Adnexa: normal adnexa and no mass, fullness, tenderness               Rectovaginal: Confirms               Anus:  normal sphincter tone, no lesions  Chaperone, Octaviano Batty, CMA, was present for exam.  Assessment/Plan: 1. Well woman exam with routine gynecological exam - pap smear not indicated today.  Neg 2021. - MMG is due.  Pt will scheduled.  Will let me know if she needs order. - pt is aware colonoscopy overdue.  She wants this done closer to home.  Will let me know if needs referral. - plan BMD closer to age 56 - lab work ordered as per below - vaccines reviewed/updated  2. Recurrent UTI - nitrofurantoin, macrocrystal-monohydrate, (MACROBID) 100 MG capsule; Take 1 capsule (100 mg total) by mouth daily.  Dispense: 90 capsule; Refill: 4  3. Other iron deficiency anemia  4. Family  history of breast cancer - Tyrer Cusick model done today.  Pt's lifetime risk is 14.4%.  Limited breast MRI discussed.  Does not desire at this time.  5. Family history of gene mutation - will need to refer pt for genetic testing and possibly additional imaging  6. Personal  history of gastric bypass - pt encouraged to follow up with bariatric provider more local to her for medical treatment of some weight gain that has occurred this past year  7. Blood tests for routine general physical examination - CBC - Comprehensive metabolic panel - TSH - VITAMIN D 25 Hydroxy (Vit-D Deficiency, Fractures) - Lipid panel - Hemoglobin A1c

## 2021-10-08 LAB — LIPID PANEL
Chol/HDL Ratio: 3 ratio (ref 0.0–4.4)
Cholesterol, Total: 192 mg/dL (ref 100–199)
HDL: 63 mg/dL (ref >39)
LDL Chol Calc (NIH): 115 mg/dL — ABNORMAL HIGH (ref 0–99)
Triglycerides: 75 mg/dL (ref 0–149)
VLDL Cholesterol Cal: 14 mg/dL (ref 5–40)

## 2021-10-08 LAB — CBC
Hematocrit: 39.1 % (ref 34.0–46.6)
Hemoglobin: 13 g/dL (ref 11.1–15.9)
MCH: 29.7 pg (ref 26.6–33.0)
MCHC: 33.2 g/dL (ref 31.5–35.7)
MCV: 89 fL (ref 79–97)
Platelets: 281 10*3/uL (ref 150–450)
RBC: 4.38 x10E6/uL (ref 3.77–5.28)
RDW: 11.9 % (ref 11.7–15.4)
WBC: 6.2 10*3/uL (ref 3.4–10.8)

## 2021-10-08 LAB — COMPREHENSIVE METABOLIC PANEL
ALT: 15 IU/L (ref 0–32)
AST: 22 IU/L (ref 0–40)
Albumin/Globulin Ratio: 2 (ref 1.2–2.2)
Albumin: 4.4 g/dL (ref 3.8–4.9)
Alkaline Phosphatase: 76 IU/L (ref 44–121)
BUN/Creatinine Ratio: 20 (ref 9–23)
BUN: 13 mg/dL (ref 6–24)
Bilirubin Total: 0.4 mg/dL (ref 0.0–1.2)
CO2: 27 mmol/L (ref 20–29)
Calcium: 9.2 mg/dL (ref 8.7–10.2)
Chloride: 103 mmol/L (ref 96–106)
Creatinine, Ser: 0.64 mg/dL (ref 0.57–1.00)
Globulin, Total: 2.2 g/dL (ref 1.5–4.5)
Glucose: 89 mg/dL (ref 70–99)
Potassium: 4.7 mmol/L (ref 3.5–5.2)
Sodium: 140 mmol/L (ref 134–144)
Total Protein: 6.6 g/dL (ref 6.0–8.5)
eGFR: 103 mL/min/{1.73_m2} (ref >59)

## 2021-10-08 LAB — HEMOGLOBIN A1C
Est. average glucose Bld gHb Est-mCnc: 117 mg/dL
Hgb A1c MFr Bld: 5.7 % — ABNORMAL HIGH (ref 4.8–5.6)

## 2021-10-08 LAB — TSH: TSH: 2.17 u[IU]/mL (ref 0.450–4.500)

## 2021-10-08 LAB — VITAMIN D 25 HYDROXY (VIT D DEFICIENCY, FRACTURES): Vit D, 25-Hydroxy: 34.3 ng/mL (ref 30.0–100.0)

## 2021-10-09 ENCOUNTER — Encounter (HOSPITAL_BASED_OUTPATIENT_CLINIC_OR_DEPARTMENT_OTHER): Payer: Self-pay | Admitting: Obstetrics & Gynecology

## 2021-10-25 ENCOUNTER — Encounter (HOSPITAL_BASED_OUTPATIENT_CLINIC_OR_DEPARTMENT_OTHER): Payer: Self-pay | Admitting: Obstetrics & Gynecology

## 2021-11-12 ENCOUNTER — Telehealth (HOSPITAL_BASED_OUTPATIENT_CLINIC_OR_DEPARTMENT_OTHER): Payer: Self-pay | Admitting: Obstetrics & Gynecology

## 2021-11-12 NOTE — Telephone Encounter (Signed)
Pt requested information at wellness visit about genetic testing.  She has been called and mychart message sent but not read.  Ezekiel Ina, RN asked to call pt and advised how to have this done remotely with virtual visit with a [facility] genetic counselor.  Pt is aware I've had difficulty finding a Dietitian close to where she lives [facility]) as I'm not familiar with that medical community.  Pt is in process of moving and will call back when ready for this. ?

## 2022-08-19 IMAGING — MG DIGITAL SCREENING BILAT W/ TOMO W/ CAD
6 of 10 series · 6 of 30 positions shown · non-contrast
Comparison: Previous exam(s).

CLINICAL DATA: Screening.

EXAM:
DIGITAL SCREENING BILATERAL MAMMOGRAM WITH TOMO AND CAD

[R CC synth-2D]
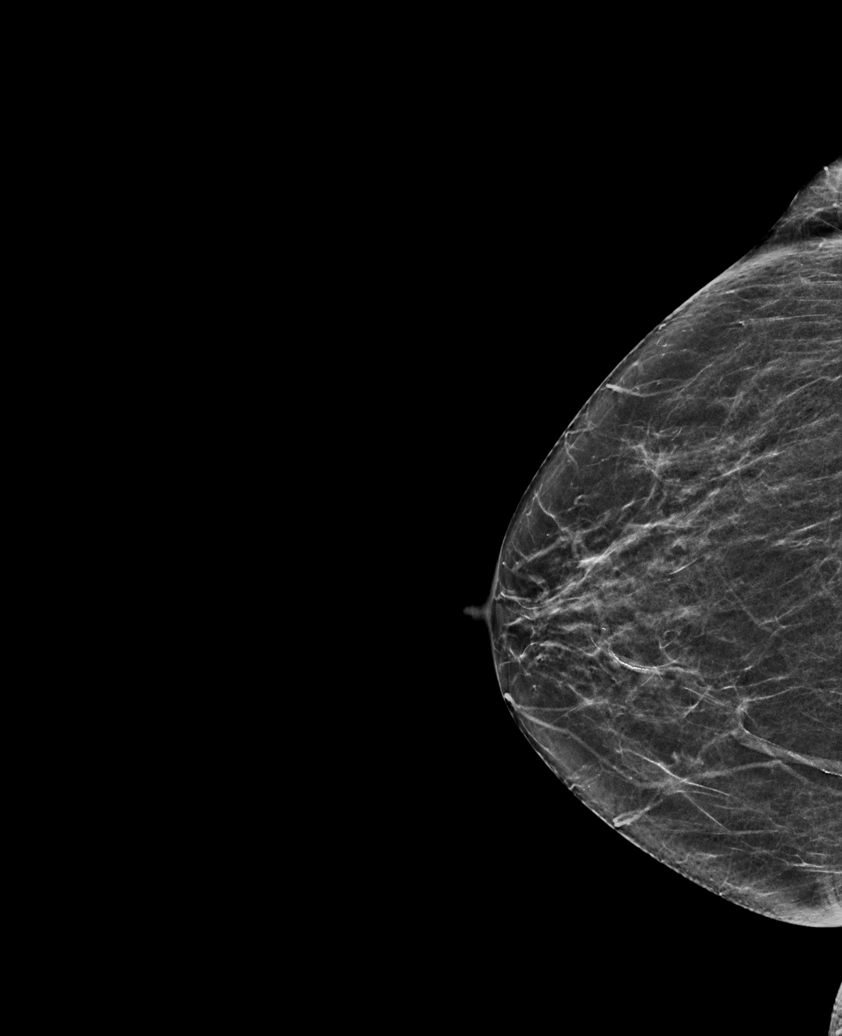

[L CC synth-2D]
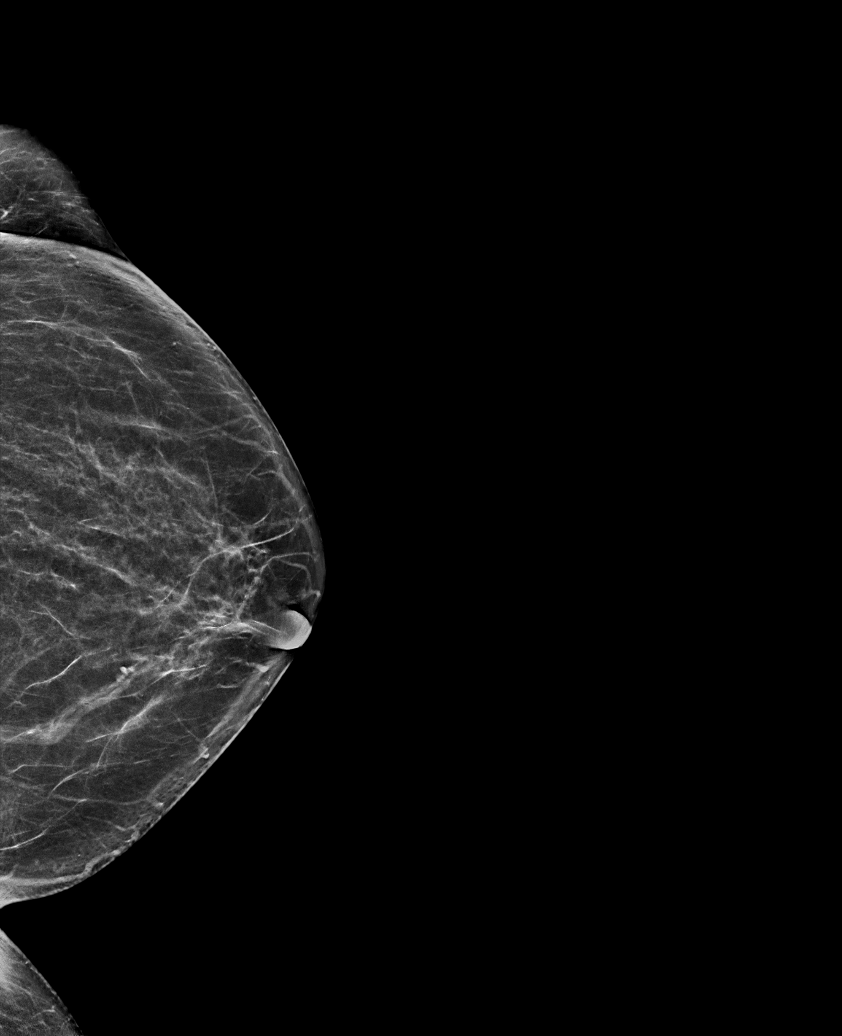

[L CV synth-2D]
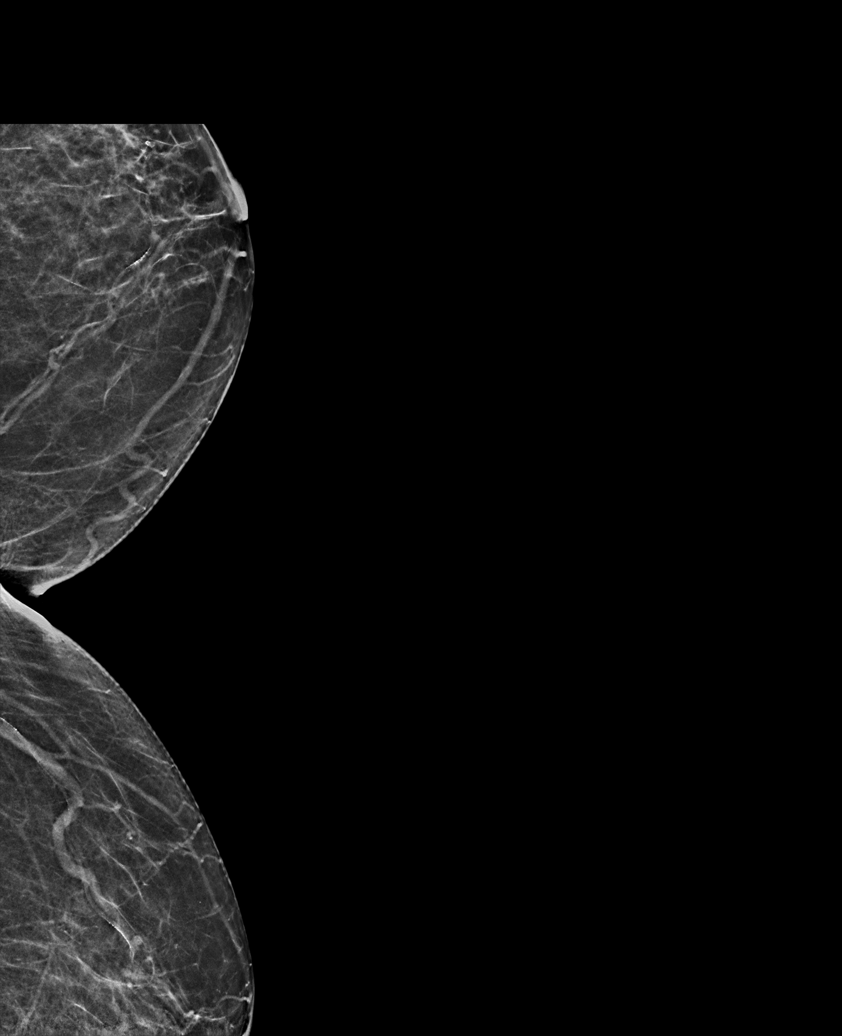

[R MLO synth-2D]
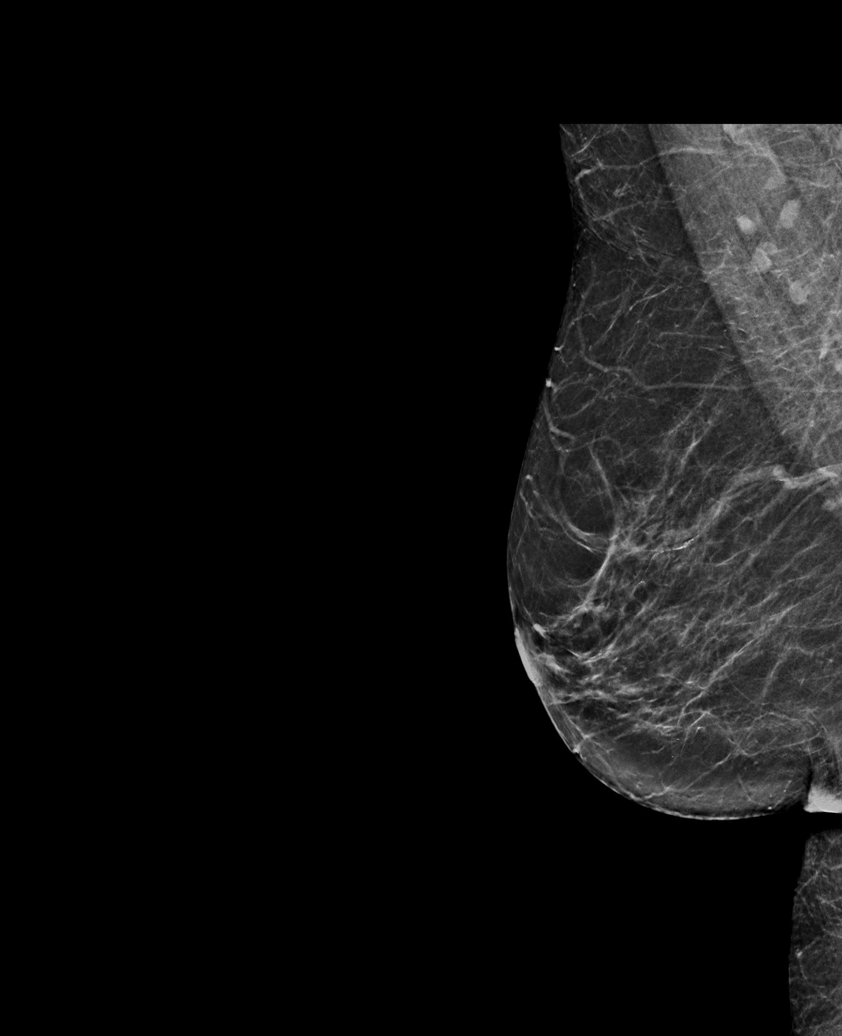

[L MLO synth-2D]
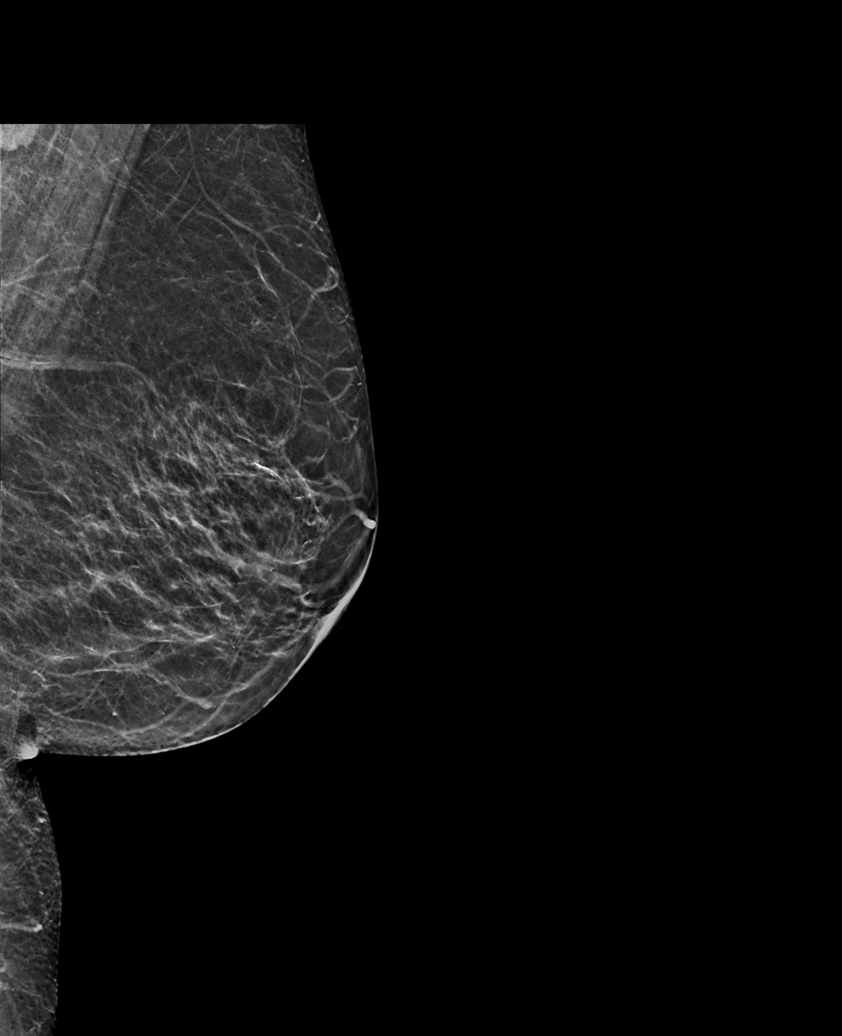

[R MLO tomo · tomo slice 31/62.0]
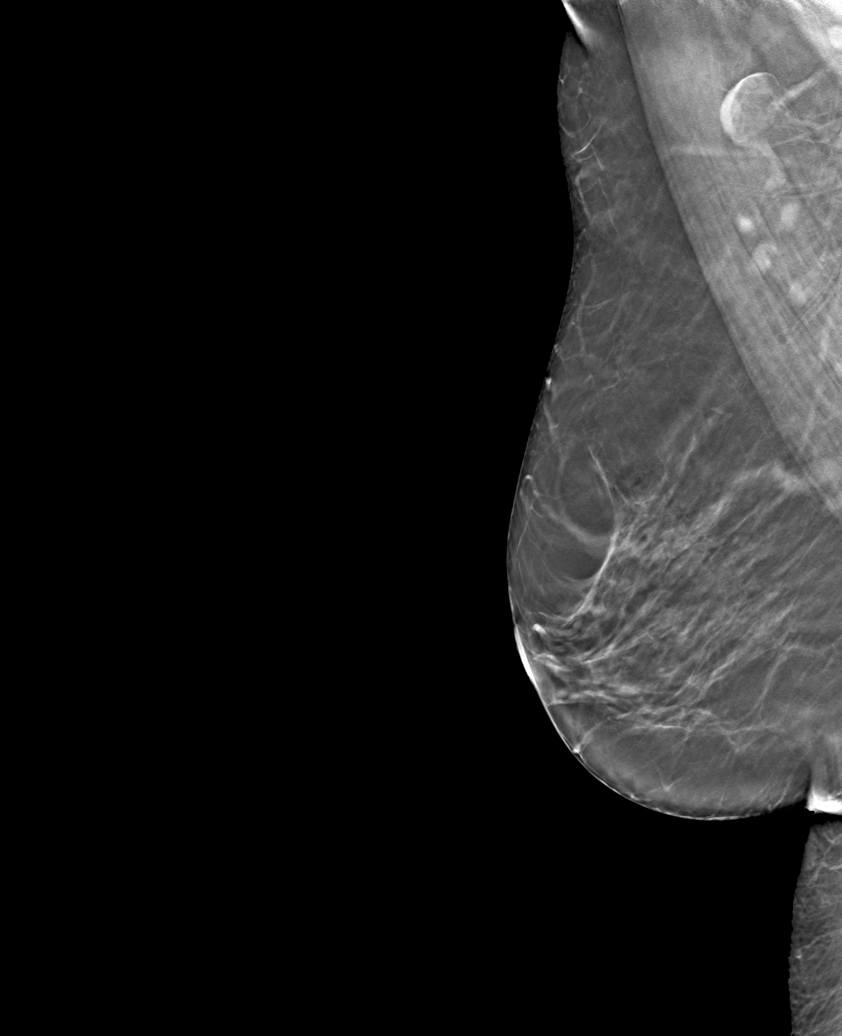

[6 of 30 positions shown; findings below may reference images not displayed]

ACR Breast Density Category b: There are scattered areas of
fibroglandular density.
FINDINGS: There are no findings suspicious for malignancy. Images were
processed with CAD.
IMPRESSION: No mammographic evidence of malignancy. A result letter of this
screening mammogram will be mailed directly to the patient.

RECOMMENDATION:
Screening mammogram in one year. (Code:[id number])

BI-RADS CATEGORY  1: Negative.

## 2022-10-10 ENCOUNTER — Ambulatory Visit (HOSPITAL_BASED_OUTPATIENT_CLINIC_OR_DEPARTMENT_OTHER): Payer: BLUE CROSS/BLUE SHIELD | Admitting: Obstetrics & Gynecology

## 2022-10-27 ENCOUNTER — Encounter: Payer: Self-pay | Admitting: *Deleted
